# Patient Record
Sex: Male | Born: 1990 | Race: Black or African American | Hispanic: No | Marital: Single | Smoking: Current every day smoker
Health system: Southern US, Community
[De-identification: ages and names within clinical notes are randomized; demographics above are authoritative.]

## PROBLEM LIST (undated history)

## (undated) DIAGNOSIS — Z789 Other specified health status: Secondary | ICD-10-CM

## (undated) DIAGNOSIS — W3400XA Accidental discharge from unspecified firearms or gun, initial encounter: Secondary | ICD-10-CM

## (undated) HISTORY — PX: LEG SURGERY: SHX1003

---
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
## 2011-05-18 LAB — COMPREHENSIVE METABOLIC PANEL
ALT: 15
AST: 20
Albumin: 4
Alkaline Phosphatase: 72
Calcium: 9.4
Glucose, Bld: 66 — ABNORMAL LOW
Potassium: 4
Sodium: 139
Total Protein: 6.9

## 2011-05-18 LAB — WOUND CULTURE: Culture: NO GROWTH

## 2011-05-18 LAB — CBC
Hemoglobin: 14.8
MCHC: 33.6
Platelets: 227
RDW: 13.8

## 2011-05-18 LAB — ANAEROBIC CULTURE

## 2012-11-16 ENCOUNTER — Encounter (HOSPITAL_COMMUNITY): Payer: Self-pay | Admitting: Emergency Medicine

## 2012-11-16 ENCOUNTER — Emergency Department (HOSPITAL_COMMUNITY)
Admission: EM | Admit: 2012-11-16 | Discharge: 2012-11-16 | Disposition: A | Discharge status: Home / Self Care | Payer: Self-pay | Attending: Emergency Medicine | Admitting: Emergency Medicine

## 2012-11-16 DIAGNOSIS — F17200 Nicotine dependence, unspecified, uncomplicated: Secondary | ICD-10-CM | POA: Insufficient documentation

## 2012-11-16 DIAGNOSIS — K047 Periapical abscess without sinus: Secondary | ICD-10-CM | POA: Insufficient documentation

## 2012-11-16 DIAGNOSIS — R220 Localized swelling, mass and lump, head: Secondary | ICD-10-CM | POA: Insufficient documentation

## 2012-11-16 MED ORDER — HYDROCODONE-ACETAMINOPHEN 5-325 MG PO TABS
2.0000 | ORAL_TABLET | Freq: Once | ORAL | Status: AC
  Administered 2012-11-16: 2 via ORAL
  Filled 2012-11-16: qty 2

## 2012-11-16 MED ORDER — PENICILLIN V POTASSIUM 500 MG PO TABS: 500.0000 mg | ORAL_TABLET | Freq: Four times a day (QID) | ORAL | Status: AC

## 2012-11-16 MED ORDER — HYDROCODONE-ACETAMINOPHEN 5-325 MG PO TABS: 2.0000 | ORAL_TABLET | ORAL | Status: DC | PRN

## 2012-11-16 NOTE — ED Provider Notes (Signed)
History     CSN: [id number]  Arrival date & time 11/16/12  1191   None     No chief complaint on file.   (Consider location/radiation/quality/duration/timing/severity/associated sxs/prior treatment) HPI Comments: Patient is a 22 year old male who presents today with worsening left upper dental pain since Saturday. The pain is sharp and radiates into his left ear. This morning he noticed that it was really swollen. He said he coughed up green stuff which is what prompted him to come into the emergency room. He is concerned that it is just going to pop and he is going to swallow the contents of the abscess. He cannot eat or drink. When drinking he tries to hold his head to the side and swallowed the contents on his right side. He did not see a dentist. He denies fever, chills, and nausea, vomiting, abdominal pain, shortness of breath, chest pain, trismus.   No past medical history on file.  No past surgical history on file.  No family history on file.  History  Substance Use Topics  . Smoking status: Not on file  . Smokeless tobacco: Not on file  . Alcohol Use: Not on file      Review of Systems  Constitutional: Negative for fever and chills.  HENT: Positive for facial swelling. Negative for sore throat and drooling.   Respiratory: Negative for shortness of breath.   Cardiovascular: Negative for chest pain.  Gastrointestinal: Negative for nausea, vomiting and abdominal pain.  All other systems reviewed and are negative.    Allergies  Review of patient's allergies indicates not on file.  Home Medications  No current outpatient prescriptions on file.  BP 157/106  Pulse 81  Temp(Src) 97.3 F (36.3 C) (Oral)  Resp 18  SpO2 100%  Physical Exam  Nursing note and vitals reviewed. Constitutional: He is oriented to person, place, and time. He appears well-developed and well-nourished. No distress.  HENT:  Head: Normocephalic and atraumatic. No trismus in the jaw.   Right Ear: External ear normal.  Left Ear: External ear normal.  Nose: Nose normal.  Mouth/Throat: Uvula is midline and mucous membranes are normal.  Abscess on left upper gums, fluctuant No submental edema, tongue elevation, trismus No signs of deep space infection   Eyes: Conjunctivae and lids are normal.  Neck: Normal range of motion. No tracheal deviation present.  Cardiovascular: Normal rate, regular rhythm and normal heart sounds.   Pulmonary/Chest: Effort normal and breath sounds normal. No stridor.  Abdominal: Soft. He exhibits no distension. There is no tenderness.  Musculoskeletal: Normal range of motion.  Neurological: He is alert and oriented to person, place, and time.  Skin: Skin is warm and dry. He is not diaphoretic.  Psychiatric: He has a normal mood and affect. His behavior is normal.    ED Course  Procedures (including critical care time)  Labs Reviewed - No data to display No results found.  INCISION AND DRAINAGE Performed by: Junious Silk Consent: Verbal consent obtained. Risks and benefits: risks, benefits and alternatives were discussed Type: abscess  Body area: left upper gum  Anesthesia: benzocaine spray, sprayed liberally over the affected area  Incision was made with a scalpel.  Complexity: complex  Drainage: purulent  Drainage amount: 2 ML   Patient tolerance: Patient tolerated the procedure well with no immediate complications.     1. Dental abscess       MDM  Patient presents today with a dental abscess. No fevers, chills, abdominal pain. No signs of  impending airway obstruction or deep space infection. Abscess was I and D without complication. Discussed care of abscess including salt water gargles. Covered with penicillin. Referral to a dentist. Resource guide given. Pain medication given. Return precautions given. Vital signs stable for discharge. Patient / Family / Caregiver informed of clinical course, understand medical  decision-making process, and agree with plan.        Mora Bellman, PA-C 11/16/12 [address] PA-C 11/16/12 1030

## 2012-11-16 NOTE — ED Notes (Signed)
Pt reports left upper toothache onset Saturday. Pt woke up today with swelling to upper left gum area. Pt used peroxide and hot salt water rinse. Pt also used Listerine rinse.

## 2012-11-16 NOTE — ED Provider Notes (Signed)
Medical screening examination/treatment/procedure(s) were conducted as a shared visit with non-physician practitioner(s) and myself.  I personally evaluated the patient during the encounter.  22yM with gingival abscess. I&D per [facility]rell's note. Mouth rinses. Abx. PRN pain meds. Dental resources.   Raeford Razor, MD 11/16/12 [id number]

## 2015-05-10 ENCOUNTER — Encounter (HOSPITAL_COMMUNITY): Payer: Self-pay

## 2015-05-10 ENCOUNTER — Emergency Department (HOSPITAL_COMMUNITY)
Admission: EM | Admit: 2015-05-10 | Discharge: 2015-05-10 | Disposition: A | Discharge status: Home / Self Care | Payer: Self-pay | Attending: Emergency Medicine | Admitting: Emergency Medicine

## 2015-05-10 DIAGNOSIS — R300 Dysuria: Secondary | ICD-10-CM | POA: Insufficient documentation

## 2015-05-10 DIAGNOSIS — Z720 Tobacco use: Secondary | ICD-10-CM | POA: Insufficient documentation

## 2015-05-10 DIAGNOSIS — Z711 Person with feared health complaint in whom no diagnosis is made: Secondary | ICD-10-CM

## 2015-05-10 DIAGNOSIS — R369 Urethral discharge, unspecified: Secondary | ICD-10-CM | POA: Insufficient documentation

## 2015-05-10 DIAGNOSIS — Z202 Contact with and (suspected) exposure to infections with a predominantly sexual mode of transmission: Secondary | ICD-10-CM | POA: Insufficient documentation

## 2015-05-10 LAB — URINALYSIS, ROUTINE W REFLEX MICROSCOPIC
BILIRUBIN URINE: NEGATIVE
Glucose, UA: NEGATIVE mg/dL
Ketones, ur: NEGATIVE mg/dL
Nitrite: NEGATIVE
PH: 5.5 (ref 5.0–8.0)
Protein, ur: 30 mg/dL — AB
SPECIFIC GRAVITY, URINE: 1.029 (ref 1.005–1.030)
UROBILINOGEN UA: 2 mg/dL — AB (ref 0.0–1.0)

## 2015-05-10 LAB — URINE MICROSCOPIC-ADD ON

## 2015-05-10 MED ORDER — AZITHROMYCIN 250 MG PO TABS
1000.0000 mg | ORAL_TABLET | Freq: Once | ORAL | Status: AC
  Administered 2015-05-10: 1000 mg via ORAL
  Filled 2015-05-10: qty 4

## 2015-05-10 MED ORDER — CEFTRIAXONE SODIUM 250 MG IJ SOLR
250.0000 mg | Freq: Once | INTRAMUSCULAR | Status: AC
  Administered 2015-05-10: 250 mg via INTRAMUSCULAR
  Filled 2015-05-10: qty 250

## 2015-05-10 NOTE — ED Notes (Signed)
Pt verbalized understanding of d/c instructions and has no further questions. Pt verbalized understanding to abstain from intercourse for 1 week until hearing about results.

## 2015-05-10 NOTE — ED Notes (Signed)
PA at bedside.

## 2015-05-10 NOTE — ED Provider Notes (Signed)
CSN: [id number]     Arrival date & time 05/10/15  1748 History   First MD Initiated Contact with Patient 05/10/15 1817     Chief Complaint  Patient presents with  . Exposure to STD     (Consider location/radiation/quality/duration/timing/severity/associated sxs/prior Treatment) HPI Comments: TRAYCE MAINO is a 24 y.o. male who presents to the ED with complaints of dysuria and penile discharge x2 days. He reports the burning dysuria is only with urination, rates it at 8/10 at his urethra, intermittent with urination, nonradiating, with no tx tried PTA. Penile discharge is yellowish. Denies fevers, chills, CP, SOB, abd pain, N/V/D/C, hematuria, testicular pain/swelling, penile pain/swelling, genital lesions, flank pain, myalgias, arthralgias, numbness, tingling, weakness, or rashes. Sexually active with 1 male partner, unprotected.  Patient is a 24 y.o. male presenting with STD exposure. The history is provided by the patient. No language interpreter was used.  Exposure to STD This is a new problem. The current episode started in the past 7 days. The problem occurs rarely. The problem has been unchanged. Associated symptoms include urinary symptoms. Pertinent negatives include no abdominal pain, arthralgias, chest pain, chills, fever, myalgias, nausea, numbness, vomiting or weakness. Exacerbated by: urinating. He has tried nothing for the symptoms. The treatment provided no relief.    History reviewed. No pertinent past medical history. Past Surgical History  Procedure Laterality Date  . Leg surgery     No family history on file. Social History  Substance Use Topics  . Smoking status: Current Every Day Smoker  . Smokeless tobacco: None  . Alcohol Use: Yes    Review of Systems  Constitutional: Negative for fever and chills.  Respiratory: Negative for shortness of breath.   Cardiovascular: Negative for chest pain.  Gastrointestinal: Negative for nausea, vomiting, abdominal  pain, diarrhea and constipation.  Genitourinary: Positive for dysuria and discharge. Negative for hematuria, flank pain, penile swelling, scrotal swelling, genital sores, penile pain and testicular pain.  Musculoskeletal: Negative for myalgias and arthralgias.  Skin: Negative for color change.  Allergic/Immunologic: Negative for immunocompromised state.  Neurological: Negative for weakness and numbness.  Psychiatric/Behavioral: Negative for confusion.   10 Systems reviewed and are negative for acute change except as noted in the HPI.    Allergies  Review of patient's allergies indicates no known allergies.  Home Medications   Prior to Admission medications   Medication Sig Start Date End Date Taking? Authorizing Provider  HYDROcodone-acetaminophen (NORCO/VICODIN) 5-325 MG per tablet Take 2 tablets by mouth every 4 (four) hours as needed for pain. 11/16/12   Junious Silk, PA-C   BP 159/90 mmHg  Pulse 90  Temp(Src) 98.4 F (36.9 C) (Oral)  Resp 16  Ht 6' (1.829 m)  Wt 169 lb 12.8 oz (77.021 kg)  BMI 23.02 kg/m2  SpO2 100% Physical Exam  Constitutional: He is oriented to person, place, and time. Vital signs are normal. He appears well-developed and well-nourished.  Non-toxic appearance. No distress.  Afebrile, nontoxic, NAD  HENT:  Head: Normocephalic and atraumatic.  Mouth/Throat: Oropharynx is clear and moist and mucous membranes are normal.  Eyes: Conjunctivae and EOM are normal. Right eye exhibits no discharge. Left eye exhibits no discharge.  Neck: Normal range of motion. Neck supple.  Cardiovascular: Normal rate, regular rhythm, normal heart sounds and intact distal pulses.  Exam reveals no gallop and no friction rub.   No murmur heard. Pulmonary/Chest: Effort normal and breath sounds normal. No respiratory distress. He has no decreased breath sounds. He has no wheezes.  He has no rhonchi. He has no rales.  Abdominal: Soft. Normal appearance and bowel sounds are normal. He  exhibits no distension. There is no tenderness. There is no rigidity, no rebound, no guarding, no CVA tenderness, no tenderness at McBurney's point and negative Murphy's sign. Hernia confirmed negative in the right inguinal area and confirmed negative in the left inguinal area.  Genitourinary: Testes normal. Right testis shows no mass, no swelling and no tenderness. Left testis shows no mass, no swelling and no tenderness. Circumcised. No phimosis, paraphimosis, hypospadias, penile erythema or penile tenderness. Discharge found.  Chaperone present for exam Circumcised penis without phimosis/paraphimosis, hypospadias, erythema, or tenderness, but with purulent discharge noted which can be expressed from urethra. Testes with no masses or tenderness, no swelling, and cremasterics reflex present bilaterally. No inguinal hernias or adenopathy present.   Musculoskeletal: Normal range of motion.  Neurological: He is alert and oriented to person, place, and time. He has normal strength. No sensory deficit.  Skin: Skin is warm, dry and intact. No rash noted.  Psychiatric: He has a normal mood and affect.  Nursing note and vitals reviewed.   ED Course  Procedures (including critical care time) Labs Review Labs Reviewed  URINALYSIS, ROUTINE W REFLEX MICROSCOPIC (NOT AT [facility]) - Abnormal; Notable for the following:    Color, Urine AMBER (*)    APPearance TURBID (*)    Hgb urine dipstick SMALL (*)    Protein, ur 30 (*)    Urobilinogen, UA 2.0 (*)    Leukocytes, UA LARGE (*)    All other components within normal limits  URINE CULTURE  URINE MICROSCOPIC-ADD ON  RPR  HIV ANTIBODY (ROUTINE TESTING)  GC/CHLAMYDIA PROBE AMP ([place]) NOT AT [facility]    Imaging Review No results found. I have personally reviewed and evaluated these images and lab results as part of my medical decision-making.   EKG Interpretation None      MDM   Final diagnoses:  Penile discharge  Dysuria  Concern about STD in  male without diagnosis    24 y.o. male here with dysuria and penile discharge. On exam, purulent drainage noted, no tenderness to penis or testicles, no skin changes. U/A sent, still pending. STD testing done now, will treat empirically for GC/CT. Discussed using condoms. Will have him f/up with [facility] for any future STD concerns. Will await U/A then reassess.  6:48 PM U/A with large leuks, rare squamous, rare bacteria, and TNTC WBC. Empiric treatment for GC/CT to be given then pt will be discharged home. F/up with [facility] for future STD concerns. I explained the diagnosis and have given explicit precautions to return to the ER including for any other new or worsening symptoms. The patient understands and accepts the medical plan as it's been dictated and I have answered their questions. Discharge instructions concerning home care and prescriptions have been given. The patient is STABLE and is discharged to home in good condition.  BP 159/90 mmHg  Pulse 90  Temp(Src) 98.4 F (36.9 C) (Oral)  Resp 16  Ht 6' (1.829 m)  Wt 169 lb 12.8 oz (77.021 kg)  BMI 23.02 kg/m2  SpO2 100%  Meds ordered this encounter  Medications  . azithromycin (ZITHROMAX) tablet 1,000 mg    Sig:    And  . cefTRIAXone (ROCEPHIN) injection 250 mg    Sig:     Order Specific Question:  Antibiotic Indication:    Answer:  STD  [address] [place], PA-C 05/10/15 1849  Eber Hong, MD 05/11/15 1625

## 2015-05-10 NOTE — Discharge Instructions (Signed)
Follow up with [facility] Department STD clinic for future STD concerns or screenings. This is the recommendation by the CDC for people with multiple sexual partners or hx of STDs. You have been treated for gonorrhea and chlamydia in the ER but the hospital will call you if lab is positive. You were tested for HIV and Syphilis, and the hospital will call you if the lab is positive. Always wear condoms when engaging in sexual activity. Since you had treatment today for presumed gonorrhea and chlamydia, refrain from sexual intercourse until you've heard back from the lab about your test results.    Dysuria Dysuria is pain or discomfort while urinating. The pain or discomfort may be felt in the tube that carries urine out of the bladder (urethra) or in the surrounding tissue of the genitals. The pain may also be felt in the groin area, lower abdomen, and lower back. You may have to urinate frequently or have the sudden feeling that you have to urinate (urgency). Dysuria can affect both men and women, but is more common in women. Dysuria can be caused by many different things, including:  Urinary tract infection in women.  Infection of the kidney or bladder.  Kidney stones or bladder stones.  Certain sexually transmitted infections (STIs), such as chlamydia.  Dehydration.  Inflammation of the vagina.  Use of certain medicines.  Use of certain soaps or scented products that cause irritation. HOME CARE INSTRUCTIONS Watch your dysuria for any changes. The following actions may help to reduce any discomfort you are feeling:  Drink enough fluid to keep your urine clear or pale yellow.  Empty your bladder often. Avoid holding urine for long periods of time.  After a bowel movement or urination, women should cleanse from front to back, using each tissue only once.  Empty your bladder after sexual intercourse.  Take medicines only as directed by your health care provider.  If you  were prescribed an antibiotic medicine, finish it all even if you start to feel better.  Avoid caffeine, tea, and alcohol. They can irritate the bladder and make dysuria worse. In men, alcohol may irritate the prostate.  Keep all follow-up visits as directed by your health care provider. This is important.  If you had any tests done to find the cause of dysuria, it is your responsibility to obtain your test results. Ask the lab or department performing the test when and how you will get your results. Talk with your health care provider if you have any questions about your results. SEEK MEDICAL CARE IF:  You develop pain in your back or sides.  You have a fever.  You have nausea or vomiting.  You have blood in your urine.  You are not urinating as often as you usually do. SEEK IMMEDIATE MEDICAL CARE IF:  You pain is severe and not relieved with medicines.  You are unable to hold down any fluids.  You or someone else notices a change in your mental function.  You have a rapid heartbeat at rest.  You have shaking or chills.  You feel extremely weak.   This information is not intended to replace advice given to you by your health care provider. Make sure you discuss any questions you have with your health care provider.   Document Released: 04/16/2004 Document Revised: 08/09/2014 Document Reviewed: 03/14/2014 Elsevier Interactive Patient Education [employer]

## 2015-05-10 NOTE — ED Notes (Signed)
Pt reports dysuria and purulent drainage from penis. Onset Thursday. Wants to be tested for STDs.

## 2015-05-11 LAB — RPR: RPR Ser Ql: NONREACTIVE

## 2015-05-11 LAB — HIV ANTIBODY (ROUTINE TESTING W REFLEX): HIV Screen 4th Generation wRfx: NONREACTIVE

## 2015-05-12 LAB — URINE CULTURE

## 2015-05-12 LAB — GC/CHLAMYDIA PROBE AMP (~~LOC~~) NOT AT ARMC
CHLAMYDIA, DNA PROBE: NEGATIVE
NEISSERIA GONORRHEA: POSITIVE — AB

## 2015-05-13 ENCOUNTER — Telehealth (HOSPITAL_BASED_OUTPATIENT_CLINIC_OR_DEPARTMENT_OTHER): Payer: Self-pay | Admitting: Emergency Medicine

## 2015-12-09 ENCOUNTER — Encounter (HOSPITAL_COMMUNITY): Payer: Self-pay | Admitting: *Deleted

## 2015-12-09 ENCOUNTER — Emergency Department (HOSPITAL_COMMUNITY)
Admission: EM | Admit: 2015-12-09 | Discharge: 2015-12-09 | Disposition: A | Discharge status: Home / Self Care | Payer: Self-pay | Attending: Emergency Medicine | Admitting: Emergency Medicine

## 2015-12-09 DIAGNOSIS — K0889 Other specified disorders of teeth and supporting structures: Secondary | ICD-10-CM | POA: Insufficient documentation

## 2015-12-09 DIAGNOSIS — Z792 Long term (current) use of antibiotics: Secondary | ICD-10-CM | POA: Insufficient documentation

## 2015-12-09 DIAGNOSIS — F17210 Nicotine dependence, cigarettes, uncomplicated: Secondary | ICD-10-CM | POA: Insufficient documentation

## 2015-12-09 MED ORDER — PENICILLIN V POTASSIUM 500 MG PO TABS: 500.0000 mg | ORAL_TABLET | Freq: Four times a day (QID) | ORAL | Status: DC

## 2015-12-09 NOTE — ED Notes (Signed)
No answer from waiting room.

## 2015-12-09 NOTE — ED Provider Notes (Signed)
History  By signing my name below, I, Sean [name], attest that this documentation has been prepared under the direction and in the presence of Rob [place], [place]. Electronically Signed: Karle [name], ED Scribe. 12/09/2015. 3:32 PM.  Chief Complaint  Patient presents with  . Dental Pain  . Oral Swelling   The history is provided by the patient and medical records. No language interpreter was used.    HPI Comments:  Sean [name] is a 25 y.o. male who presents to the Emergency Department complaining of severe left upper dental pain that began two days ago. He reports associated left sided facial swelling. He states he believes he has an abscess and states he does not have a tooth in the area. He does not currently have a dentist. He has not taken anything for pain. Talking and eating increases his pain. He denies alleviating factors. He denies fever, chills, nausea, vomiting. He denies any known medications allergies.   History reviewed. No pertinent past medical history. Past Surgical History  Procedure Laterality Date  . Leg surgery     No family history on file. Social History  Substance Use Topics  . Smoking status: Current Every Day Smoker -- 1.00 packs/day    Types: Cigarettes  . Smokeless tobacco: None  . Alcohol Use: Yes     Comment: occ    Review of Systems  Constitutional: Negative for fever and chills.  HENT: Positive for dental problem and facial swelling.   Gastrointestinal: Negative for nausea and vomiting.    Allergies  Review of patient's allergies indicates no known allergies.  Home Medications   Prior to Admission medications   Medication Sig Start Date End Date Taking? Authorizing Provider  penicillin v potassium (VEETID) 500 MG tablet Take 1 tablet (500 mg total) by mouth 4 (four) times daily. 12/09/15   Roxy Horseman, PA-C   Triage Vitals: BP 164/98 mmHg  Pulse 92  Temp(Src) 99 F (37.2 C) (Oral)  Resp 18  SpO2 100% Physical Exam   Physical Exam  Constitutional: Pt appears well-developed and well-nourished.  HENT:  Head: Normocephalic.  Right Ear: Tympanic membrane, external ear and ear canal normal.  Left Ear: Tympanic membrane, external ear and ear canal normal.  Nose: Nose normal. Right sinus exhibits no maxillary sinus tenderness and no frontal sinus tenderness. Left sinus exhibits no maxillary sinus tenderness and no frontal sinus tenderness.  Mouth/Throat: Uvula is midline, oropharynx is clear and moist and mucous membranes are normal. No oral lesions. No uvula swelling or lacerations. No oropharyngeal exudate, posterior oropharyngeal edema, posterior oropharyngeal erythema or tonsillar abscesses.  Poor dentition No gingival swelling, fluctuance or induration No gross abscess  No sublingual edema, tenderness to palpation, or sign of Ludwig's angina, or deep space infection Pain at left upper rear molar Eyes: Conjunctivae are normal. Pupils are equal, round, and reactive to light. Right eye exhibits no discharge. Left eye exhibits no discharge.  Neck: Normal range of motion. Neck supple.  No stridor Handling secretions without difficulty No nuchal rigidity No cervical lymphadenopathy Cardiovascular: Normal rate, regular rhythm and normal heart sounds.   Pulmonary/Chest: Effort normal. No respiratory distress.  Equal chest rise  Abdominal: Soft. Bowel sounds are normal. Pt exhibits no distension. There is no tenderness.  Lymphadenopathy: Pt has no cervical adenopathy.  Neurological: Pt is alert and oriented x 4  Skin: Skin is warm and dry.  Psychiatric: Pt has a normal mood and affect.  Nursing note and vitals reviewed.    ED  Course  Procedures (including critical care time) DIAGNOSTIC STUDIES: Oxygen Saturation is 100% on RA, normal by my interpretation.   COORDINATION OF CARE: 3:30 PM- Will prescribe antibiotics and give dental referral. Advised pt to take OTC Ibuprofen/Tylenol for pain. Pt  verbalizes understanding and agrees to plan.    MDM   Final diagnoses:  Pain, dental    Patient with dentalgia.  No abscess requiring immediate incision and drainage.  Exam not concerning for Ludwig's angina or pharyngeal abscess.  Will treat with penicillin. Pt instructed to follow-up with dentist.  Discussed return precautions. Pt safe for discharge.   I personally performed the services described in this documentation, which was scribed in my presence. The recorded information has been reviewed and is accurate.       Roxy Horsemanobert Daissy Yerian, PA-C 12/09/15 1533  Pricilla LovelessScott Goldston, MD 12/10/15 1655

## 2015-12-09 NOTE — Discharge Instructions (Signed)
Community Resource Guide Dental °The [employer]’s “211” is a great source of information about community services available.  Access by dialing 2-1-1 from anywhere in [place], or by website -  www.nc211.org.  ° °Other Local Resources (Updated 08/2015) ° °Dental  Care °  °Services ° °  °Phone Number and Address  °Cost  °[facility] For children 0 - 26 years of age:  °• Cleaning °• Tooth brushing/flossing instruction °• Sealants, fillings, crowns °• Extractions °• Emergency treatment  [phone] °[address] °[place], [place] [address] Charges based on family income.  Medicaid and some insurance plans accepted.   °  °[facility] [place] • Cleaning °• Sealants, fillings, crowns °• Extractions °• Emergency treatment [phone] °[address] °[place], [place] ° Pregnant women 18 years of age or older with a Medicaid card  °[facility] [place] • Cleaning °• Sealants, fillings, crowns °• Extractions °• Emergency treatment [phone] °[address] °[place], [place] Pregnant women 18 years of age or older with a Medicaid card  °[facility] For children 0 - 26 years of age:  °• Cleaning °• Tooth brushing/flossing instruction °• Sealants, fillings, crowns °• Extractions °• Emergency treatment °Limited orthodontic services for patients with Medicaid [phone] °[address] °[place], [place] [address] Medicaid and [place] Health Choice cover for children up to age 26 and pregnant women.  Parents of children up to age 26 without Medicaid pay a reduced fee at time of service.  °[facility] [place] For children 0 - 26 years of age:  °• Cleaning °• Tooth brushing/flossing instruction °• Sealants, fillings, crowns °• Extractions °• Emergency treatment °Limited orthodontic services for patients with Medicaid  [phone] °[address] °[place], [place].  Medicaid and [place] Health Choice cover for children up to age 26 and pregnant women.  Parents of children up to age 26 without Medicaid pay a reduced fee.  °[facility] • Cleaning °• Sealants, fillings, crowns °• Extractions ° °Hours: Tuesdays and Thursdays, 4:15 - 8 pm [phone] °[address] °[place], [place] [address] Services free of charge to [place] residents ages 18-64 who do not have health insurance, Medicare, Medicaid, or VA benefits and fall within federal poverty guidelines  °[facility] ° ° ° Provides dental care in addition to primary medical care, nutritional counseling, and pharmacy: °• Cleaning °• Sealants, fillings, crowns °• Extractions ° ° ° ° ° ° ° ° ° ° ° ° ° ° ° ° ° [phone] °[facility], [address] °[place], [place] ° °[phone] °[facility], [address] [place], [place] ° °[phone] °[facility] °[place], [place] ° °[phone] °[facility], [address] °[place], [place] ° °[phone] °[facility] °[address] °[place], [place] Accepts Medicaid, Medicare, most insurance.  Also provides services available to all with fees adjusted based on ability to pay.    °[facility] • Cleaning °• Tooth brushing/flossing instruction °• Sealants, fillings, crowns °• Extractions °• Emergency treatment °Hours: Tuesdays, Thursdays, and Fridays from 8 am to 5 pm by appointment only. [phone] °[address] °[place], [place] [address] [place] residents with Medicaid (depending on eligibility) and children with [place] Health Choice - call for more information.  °  [facility] • Extractions only ° °Hours: 2nd and 4th Thursday of each month from 6:30 am - 9 am.   [phone] ext. [phone] °[address] °[place], [place] [address] Ages 18 and older only.  Patients are seen on a first come, first served basis.  °[facility] • Cleanings °• Fillings °• Extractions °• Orthodontics °• Endodontics °• Implants/Crowns/Bridges °• Complete and partial dentures [phone] °[place], [place] Patients must complete an application for services.  There is often a waiting list.   ° °

## 2015-12-09 NOTE — ED Notes (Signed)
Pt reports dental pain and swelling x 2 days.  Has a broken L upper tooth.

## 2015-12-09 NOTE — ED Notes (Signed)
Called x 1 no answer

## 2016-06-29 ENCOUNTER — Encounter (HOSPITAL_COMMUNITY): Payer: Self-pay | Admitting: Emergency Medicine

## 2016-06-29 ENCOUNTER — Ambulatory Visit (HOSPITAL_COMMUNITY)
Admission: EM | Admit: 2016-06-29 | Discharge: 2016-06-29 | Disposition: A | Discharge status: Home / Self Care | Payer: Self-pay | Attending: Family Medicine | Admitting: Family Medicine

## 2016-06-29 DIAGNOSIS — R05 Cough: Secondary | ICD-10-CM | POA: Insufficient documentation

## 2016-06-29 DIAGNOSIS — R509 Fever, unspecified: Secondary | ICD-10-CM | POA: Insufficient documentation

## 2016-06-29 DIAGNOSIS — Z0001 Encounter for general adult medical examination with abnormal findings: Secondary | ICD-10-CM | POA: Insufficient documentation

## 2016-06-29 DIAGNOSIS — F17210 Nicotine dependence, cigarettes, uncomplicated: Secondary | ICD-10-CM | POA: Insufficient documentation

## 2016-06-29 DIAGNOSIS — R6889 Other general symptoms and signs: Secondary | ICD-10-CM

## 2016-06-29 DIAGNOSIS — J40 Bronchitis, not specified as acute or chronic: Secondary | ICD-10-CM | POA: Insufficient documentation

## 2016-06-29 HISTORY — DX: Accidental discharge from unspecified firearms or gun, initial encounter: W34.00XA

## 2016-06-29 MED ORDER — IBUPROFEN 800 MG PO TABS
ORAL_TABLET | ORAL | Status: AC
  Filled 2016-06-29: qty 1

## 2016-06-29 MED ORDER — LIDOCAINE HCL 2 % IJ SOLN
INTRAMUSCULAR | Status: AC
  Filled 2016-06-29: qty 20

## 2016-06-29 MED ORDER — HYDROCODONE-HOMATROPINE 5-1.5 MG/5ML PO SYRP: 5.0000 mL | ORAL_SOLUTION | Freq: Four times a day (QID) | ORAL | 0 refills | Status: DC | PRN

## 2016-06-29 MED ORDER — ACETAMINOPHEN 325 MG PO TABS
ORAL_TABLET | ORAL | Status: AC
  Filled 2016-06-29: qty 3

## 2016-06-29 MED ORDER — IBUPROFEN 800 MG PO TABS
800.0000 mg | ORAL_TABLET | Freq: Once | ORAL | Status: AC
  Administered 2016-06-29: 800 mg via ORAL

## 2016-06-29 MED ORDER — ACETAMINOPHEN 325 MG PO TABS
650.0000 mg | ORAL_TABLET | Freq: Once | ORAL | Status: AC
  Administered 2016-06-29: 650 mg via ORAL

## 2016-06-29 MED ORDER — AZITHROMYCIN 250 MG PO TABS: 250.0000 mg | ORAL_TABLET | Freq: Every day | ORAL | 0 refills | Status: DC
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
## 2016-06-29 NOTE — ED Triage Notes (Signed)
General body aches.  Reports running a fever last night.  Runny nose, cough, says green phlegm.

## 2016-06-30 LAB — POCT RAPID STREP A: Streptococcus, Group A Screen (Direct): NEGATIVE

## 2016-07-02 LAB — CULTURE, GROUP A STREP (THRC)

## 2018-02-03 ENCOUNTER — Emergency Department (HOSPITAL_COMMUNITY)
Admission: EM | Admit: 2018-02-03 | Discharge: 2018-02-03 | Disposition: A | Discharge status: Home / Self Care | Payer: Self-pay | Attending: Emergency Medicine | Admitting: Emergency Medicine

## 2018-02-03 ENCOUNTER — Other Ambulatory Visit: Payer: Self-pay

## 2018-02-03 DIAGNOSIS — Z79899 Other long term (current) drug therapy: Secondary | ICD-10-CM | POA: Insufficient documentation

## 2018-02-03 DIAGNOSIS — F17210 Nicotine dependence, cigarettes, uncomplicated: Secondary | ICD-10-CM | POA: Insufficient documentation

## 2018-02-03 DIAGNOSIS — L03012 Cellulitis of left finger: Secondary | ICD-10-CM | POA: Insufficient documentation

## 2018-02-03 DIAGNOSIS — Z23 Encounter for immunization: Secondary | ICD-10-CM | POA: Insufficient documentation

## 2018-02-03 MED ORDER — CEPHALEXIN 500 MG PO CAPS: 500.0000 mg | ORAL_CAPSULE | Freq: Four times a day (QID) | ORAL | 0 refills | Status: DC

## 2018-02-03 MED ORDER — TETANUS-DIPHTH-ACELL PERTUSSIS 5-2.5-18.5 LF-MCG/0.5 IM SUSP
0.5000 mL | Freq: Once | INTRAMUSCULAR | Status: AC
  Administered 2018-02-03: 0.5 mL via INTRAMUSCULAR
  Filled 2018-02-03: qty 0.5

## 2018-02-03 MED ORDER — LIDOCAINE HCL (PF) 1 % IJ SOLN
10.0000 mL | Freq: Once | INTRAMUSCULAR | Status: AC
  Administered 2018-02-03: 10 mL
  Filled 2018-02-03: qty 10

## 2018-02-03 NOTE — ED Provider Notes (Signed)
Patient placed in Quick Look pathway, seen and evaluated   Chief Complaint:  Left thumb pain  HPI:   Sean MangesRichard A [name] is a 27 y.o. male who presents to the ED with with left thumb swelling and pain. Patient reports that the symptoms started 3 days ago as a hangnail and has gotten worse. Patient tried to stick a needle in the area and it got worse.   ROS: skin: infection, swelling and pain to the left thumb  Physical Exam:   BP (!) 155/108 (BP Location: Right Arm)   Pulse 95   Temp 98.5 F (36.9 C) (Oral)   Resp 16   SpO2 98%   Gen: No distress  Neuro: Awake and Alert  Skin: swelling and erythema to the distal aspect of the left thumb.     Initiation of care has begun. The patient has been counseled on the process, plan, and necessity for staying for the completion/evaluation, and the remainder of the medical screening examination    Janne Napoleoneese, Zaccai Chavarin M, NP 02/03/18 1320    Jacalyn LefevreHaviland, Julie, MD 02/03/18 1414
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
## 2018-02-03 NOTE — ED Notes (Signed)
Bacitracin and clean dressing applied to right thumb.

## 2018-02-03 NOTE — ED Triage Notes (Signed)
Patient to ED c/o L thumb pain x 3 days - started as hangnail that he pulled and after seeing increased pus, tried to drain it with a needle himself. Swelling noted to distal end of L thumb. Pt denies fevers.

## 2018-02-03 NOTE — Discharge Instructions (Addendum)
Please take entire course of antibiotics as directed, soak finger in warm clean water 2-3 times a day to promote drainage, return to the emergency department if you have any worsening swelling, redness or recollection of fluid in your finger or any fevers.

## 2018-07-21 ENCOUNTER — Emergency Department (HOSPITAL_COMMUNITY): Payer: Self-pay

## 2018-07-21 ENCOUNTER — Encounter (HOSPITAL_COMMUNITY): Payer: Self-pay | Admitting: Emergency Medicine

## 2018-07-21 ENCOUNTER — Emergency Department (HOSPITAL_COMMUNITY)
Admission: EM | Admit: 2018-07-21 | Discharge: 2018-07-21 | Disposition: A | Discharge status: Home / Self Care | Payer: Self-pay | Attending: Emergency Medicine | Admitting: Emergency Medicine

## 2018-07-21 DIAGNOSIS — R0789 Other chest pain: Secondary | ICD-10-CM | POA: Insufficient documentation

## 2018-07-21 DIAGNOSIS — R0981 Nasal congestion: Secondary | ICD-10-CM | POA: Insufficient documentation

## 2018-07-21 DIAGNOSIS — F17210 Nicotine dependence, cigarettes, uncomplicated: Secondary | ICD-10-CM | POA: Insufficient documentation

## 2018-07-21 DIAGNOSIS — J40 Bronchitis, not specified as acute or chronic: Secondary | ICD-10-CM | POA: Insufficient documentation

## 2018-07-21 DIAGNOSIS — R9431 Abnormal electrocardiogram [ECG] [EKG]: Secondary | ICD-10-CM | POA: Insufficient documentation

## 2018-07-21 LAB — BASIC METABOLIC PANEL
ANION GAP: 9 (ref 5–15)
BUN: 6 mg/dL (ref 6–20)
CO2: 24 mmol/L (ref 22–32)
Calcium: 9.4 mg/dL (ref 8.9–10.3)
Chloride: 105 mmol/L (ref 98–111)
Creatinine, Ser: 0.87 mg/dL (ref 0.61–1.24)
GFR calc non Af Amer: >60 mL/min (ref >60)
GLUCOSE: 101 mg/dL — AB (ref 70–99)
POTASSIUM: 3.9 mmol/L (ref 3.5–5.1)
Sodium: 138 mmol/L (ref 135–145)

## 2018-07-21 LAB — CBC WITH DIFFERENTIAL/PLATELET
ABS IMMATURE GRANULOCYTES: 0.03 10*3/uL (ref 0.00–0.07)
Basophils Absolute: 0 10*3/uL (ref 0.0–0.1)
Basophils Relative: 1 %
Eosinophils Absolute: 0.1 10*3/uL (ref 0.0–0.5)
Eosinophils Relative: 1 %
HEMATOCRIT: 51 % (ref 39.0–52.0)
Hemoglobin: 16.6 g/dL (ref 13.0–17.0)
IMMATURE GRANULOCYTES: 1 %
LYMPHS ABS: 2 10*3/uL (ref 0.7–4.0)
Lymphocytes Relative: 32 %
MCH: 30.1 pg (ref 26.0–34.0)
MCHC: 32.5 g/dL (ref 30.0–36.0)
MCV: 92.4 fL (ref 80.0–100.0)
MONOS PCT: 11 %
Monocytes Absolute: 0.7 10*3/uL (ref 0.1–1.0)
NEUTROS ABS: 3.5 10*3/uL (ref 1.7–7.7)
Neutrophils Relative %: 54 %
Platelets: 209 10*3/uL (ref 150–400)
RBC: 5.52 MIL/uL (ref 4.22–5.81)
RDW: 15.7 % — ABNORMAL HIGH (ref 11.5–15.5)
WBC: 6.3 10*3/uL (ref 4.0–10.5)
nRBC: 0 % (ref 0.0–0.2)

## 2018-07-21 LAB — I-STAT TROPONIN, ED: Troponin i, poc: 0 ng/mL (ref 0.00–0.08)

## 2018-07-21 LAB — MAGNESIUM: MAGNESIUM: 1.7 mg/dL (ref 1.7–2.4)

## 2018-07-21 MED ORDER — AZITHROMYCIN 250 MG PO TABS: 250.0000 mg | ORAL_TABLET | Freq: Every day | ORAL | 0 refills | Status: DC

## 2018-07-21 MED ORDER — ALBUTEROL SULFATE HFA 108 (90 BASE) MCG/ACT IN AERS
2.0000 | INHALATION_SPRAY | Freq: Once | RESPIRATORY_TRACT | Status: AC
  Administered 2018-07-21: 2 via RESPIRATORY_TRACT
  Filled 2018-07-21: qty 6.7

## 2018-07-21 MED ORDER — ACETAMINOPHEN 500 MG PO TABS
1000.0000 mg | ORAL_TABLET | Freq: Once | ORAL | Status: AC
  Administered 2018-07-21: 1000 mg via ORAL
  Filled 2018-07-21: qty 2

## 2018-07-21 NOTE — Discharge Instructions (Signed)
You were seen in the ER for cough and right-sided chest pain.  I think your symptoms are from bronchitis or possibly early pneumonia.  We will treat this with albuterol inhaler and azithromycin.  Take these as prescribed.  You can use over-the-counter nasal sprays to help with nasal congestion.  Take ibuprofen or acetaminophen for chest wall pain.  We discussed the abnormalities on her EKG.  These are not specific.  You need to follow-up with cardiology for further discussion of this to determine if any more work-up needs to be done.  Return to the ER for fever, worsening cough, shortness of breath or chest pain on exertion, palpitations, lightheadedness.  Try to cut back on cigarette use.

## 2018-07-21 NOTE — ED Notes (Addendum)
Pt stable and ambulatory for discharge, states understanding follow up regarding cardiologist and with high blood pressure

## 2018-07-21 NOTE — ED Triage Notes (Signed)
Pt in c/o right rib/flank pain, states he has been coughing the last several days and coughed so hard last night he vomited x1, noted streaks of blood in his vomit, post tussive emesis occurred again x1 this morning, no distress noted
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
## 2018-07-22 ENCOUNTER — Other Ambulatory Visit: Payer: Self-pay

## 2018-07-22 ENCOUNTER — Inpatient Hospital Stay (HOSPITAL_COMMUNITY)
Admission: EM | Admit: 2018-07-22 | Discharge: 2018-07-26 | DRG: 194 | Disposition: A | Discharge status: Home / Self Care | Payer: Self-pay | Attending: Internal Medicine | Admitting: Internal Medicine

## 2018-07-22 ENCOUNTER — Encounter (HOSPITAL_COMMUNITY): Payer: Self-pay | Admitting: Internal Medicine

## 2018-07-22 ENCOUNTER — Emergency Department (HOSPITAL_COMMUNITY): Payer: Self-pay

## 2018-07-22 DIAGNOSIS — J181 Lobar pneumonia, unspecified organism: Secondary | ICD-10-CM

## 2018-07-22 DIAGNOSIS — F17210 Nicotine dependence, cigarettes, uncomplicated: Secondary | ICD-10-CM | POA: Diagnosis present

## 2018-07-22 DIAGNOSIS — J189 Pneumonia, unspecified organism: Principal | ICD-10-CM | POA: Diagnosis present

## 2018-07-22 DIAGNOSIS — R042 Hemoptysis: Secondary | ICD-10-CM

## 2018-07-22 DIAGNOSIS — I10 Essential (primary) hypertension: Secondary | ICD-10-CM | POA: Diagnosis present

## 2018-07-22 DIAGNOSIS — Z79899 Other long term (current) drug therapy: Secondary | ICD-10-CM

## 2018-07-22 DIAGNOSIS — Z6835 Body mass index (BMI) 35.0-35.9, adult: Secondary | ICD-10-CM

## 2018-07-22 DIAGNOSIS — E872 Acidosis: Secondary | ICD-10-CM | POA: Diagnosis present

## 2018-07-22 DIAGNOSIS — E669 Obesity, unspecified: Secondary | ICD-10-CM | POA: Diagnosis present

## 2018-07-22 DIAGNOSIS — Z720 Tobacco use: Secondary | ICD-10-CM

## 2018-07-22 HISTORY — DX: Other specified health status: Z78.9

## 2018-07-22 LAB — CBC WITH DIFFERENTIAL/PLATELET
Abs Immature Granulocytes: 0.02 10*3/uL (ref 0.00–0.07)
BASOS ABS: 0 10*3/uL (ref 0.0–0.1)
BASOS PCT: 1 %
EOS ABS: 0 10*3/uL (ref 0.0–0.5)
Eosinophils Relative: 0 %
HCT: 51 % (ref 39.0–52.0)
Hemoglobin: 16.4 g/dL (ref 13.0–17.0)
Immature Granulocytes: 0 %
LYMPHS ABS: 1.6 10*3/uL (ref 0.7–4.0)
Lymphocytes Relative: 22 %
MCH: 30.1 pg (ref 26.0–34.0)
MCHC: 32.2 g/dL (ref 30.0–36.0)
MCV: 93.6 fL (ref 80.0–100.0)
Monocytes Absolute: 0.7 10*3/uL (ref 0.1–1.0)
Monocytes Relative: 10 %
NEUTROS PCT: 67 %
NRBC: 0 % (ref 0.0–0.2)
Neutro Abs: 4.9 10*3/uL (ref 1.7–7.7)
PLATELETS: 209 10*3/uL (ref 150–400)
RBC: 5.45 MIL/uL (ref 4.22–5.81)
RDW: 15.8 % — AB (ref 11.5–15.5)
WBC: 7.2 10*3/uL (ref 4.0–10.5)

## 2018-07-22 LAB — TYPE AND SCREEN
ABO/RH(D): O POS
Antibody Screen: NEGATIVE

## 2018-07-22 LAB — RESPIRATORY PANEL BY PCR
Adenovirus: NOT DETECTED
Bordetella pertussis: NOT DETECTED
Chlamydophila pneumoniae: NOT DETECTED
Coronavirus 229E: NOT DETECTED
Coronavirus HKU1: NOT DETECTED
Coronavirus NL63: NOT DETECTED
Coronavirus OC43: NOT DETECTED
INFLUENZA B-RVPPCR: NOT DETECTED
Influenza A: NOT DETECTED
Metapneumovirus: NOT DETECTED
Mycoplasma pneumoniae: NOT DETECTED
Parainfluenza Virus 1: NOT DETECTED
Parainfluenza Virus 2: NOT DETECTED
Parainfluenza Virus 3: NOT DETECTED
Parainfluenza Virus 4: NOT DETECTED
Respiratory Syncytial Virus: NOT DETECTED
Rhinovirus / Enterovirus: NOT DETECTED

## 2018-07-22 LAB — INFLUENZA PANEL BY PCR (TYPE A & B)
INFLAPCR: NEGATIVE
INFLBPCR: NEGATIVE

## 2018-07-22 LAB — COMPREHENSIVE METABOLIC PANEL
ALBUMIN: 4 g/dL (ref 3.5–5.0)
ALK PHOS: 61 U/L (ref 38–126)
ALT: 18 U/L (ref 0–44)
AST: 24 U/L (ref 15–41)
Anion gap: 13 (ref 5–15)
BUN: 6 mg/dL (ref 6–20)
CALCIUM: 9.4 mg/dL (ref 8.9–10.3)
CHLORIDE: 105 mmol/L (ref 98–111)
CO2: 19 mmol/L — AB (ref 22–32)
Creatinine, Ser: 1.14 mg/dL (ref 0.61–1.24)
GFR calc non Af Amer: >60 mL/min (ref >60)
GLUCOSE: 147 mg/dL — AB (ref 70–99)
Potassium: 3.6 mmol/L (ref 3.5–5.1)
SODIUM: 137 mmol/L (ref 135–145)
Total Bilirubin: 1 mg/dL (ref 0.3–1.2)
Total Protein: 7.4 g/dL (ref 6.5–8.1)

## 2018-07-22 LAB — ABO/RH: ABO/RH(D): O POS

## 2018-07-22 LAB — STREP PNEUMONIAE URINARY ANTIGEN: Strep Pneumo Urinary Antigen: NEGATIVE

## 2018-07-22 LAB — I-STAT CG4 LACTIC ACID, ED
LACTIC ACID, VENOUS: 2.15 mmol/L — AB (ref 0.5–1.9)
LACTIC ACID, VENOUS: 0.98 mmol/L (ref 0.5–1.9)

## 2018-07-22 LAB — PROTIME-INR
INR: 1
Prothrombin Time: 13.1 seconds (ref 11.4–15.2)

## 2018-07-22 MED ORDER — IOPAMIDOL (ISOVUE-370) INJECTION 76%
100.0000 mL | Freq: Once | INTRAVENOUS | Status: AC | PRN
  Administered 2018-07-22: 100 mL via INTRAVENOUS

## 2018-07-22 MED ORDER — SODIUM CHLORIDE 0.9 % IV SOLN
1.0000 g | Freq: Once | INTRAVENOUS | Status: AC
  Administered 2018-07-22: 1 g via INTRAVENOUS
  Filled 2018-07-22: qty 10

## 2018-07-22 MED ORDER — SODIUM CHLORIDE 0.9 % IV SOLN
500.0000 mg | INTRAVENOUS | Status: DC
  Administered 2018-07-23 – 2018-07-26 (×4): 500 mg via INTRAVENOUS
  Filled 2018-07-22 (×4): qty 500

## 2018-07-22 MED ORDER — HYDROCODONE-HOMATROPINE 5-1.5 MG/5ML PO SYRP: 5.0000 mL | ORAL_SOLUTION | Freq: Four times a day (QID) | ORAL | Status: DC | PRN

## 2018-07-22 MED ORDER — FENTANYL CITRATE (PF) 100 MCG/2ML IJ SOLN
50.0000 ug | Freq: Once | INTRAMUSCULAR | Status: AC
  Administered 2018-07-22: 50 ug via INTRAVENOUS
  Filled 2018-07-22: qty 2

## 2018-07-22 MED ORDER — ALBUTEROL SULFATE (2.5 MG/3ML) 0.083% IN NEBU
2.5000 mg | INHALATION_SOLUTION | Freq: Four times a day (QID) | RESPIRATORY_TRACT | Status: DC
  Administered 2018-07-22 – 2018-07-23 (×4): 2.5 mg via RESPIRATORY_TRACT
  Filled 2018-07-22 (×4): qty 3

## 2018-07-22 MED ORDER — LACTATED RINGERS IV SOLN
INTRAVENOUS | Status: AC
  Administered 2018-07-22 (×2): via INTRAVENOUS

## 2018-07-22 MED ORDER — ALBUTEROL SULFATE HFA 108 (90 BASE) MCG/ACT IN AERS
1.0000 | INHALATION_SPRAY | Freq: Four times a day (QID) | RESPIRATORY_TRACT | Status: DC | PRN
  Filled 2018-07-22: qty 6.7

## 2018-07-22 MED ORDER — PREDNISONE 20 MG PO TABS
20.0000 mg | ORAL_TABLET | Freq: Every day | ORAL | Status: AC
  Administered 2018-07-22 – 2018-07-26 (×5): 20 mg via ORAL
  Filled 2018-07-22 (×5): qty 1

## 2018-07-22 MED ORDER — SENNOSIDES-DOCUSATE SODIUM 8.6-50 MG PO TABS: 1.0000 | ORAL_TABLET | Freq: Every evening | ORAL | Status: DC | PRN

## 2018-07-22 MED ORDER — SODIUM CHLORIDE 0.9 % IV SOLN
500.0000 mg | Freq: Once | INTRAVENOUS | Status: AC
  Administered 2018-07-22: 500 mg via INTRAVENOUS
  Filled 2018-07-22: qty 500

## 2018-07-22 MED ORDER — ACETAMINOPHEN 650 MG RE SUPP: 650.0000 mg | Freq: Four times a day (QID) | RECTAL | Status: DC | PRN

## 2018-07-22 MED ORDER — IOPAMIDOL (ISOVUE-370) INJECTION 76%
INTRAVENOUS | Status: AC
  Filled 2018-07-22: qty 100

## 2018-07-22 MED ORDER — TRAMADOL HCL 50 MG PO TABS
50.0000 mg | ORAL_TABLET | Freq: Four times a day (QID) | ORAL | Status: DC | PRN
  Administered 2018-07-22 – 2018-07-23 (×5): 50 mg via ORAL
  Filled 2018-07-22 (×5): qty 1

## 2018-07-22 MED ORDER — ONDANSETRON HCL 4 MG PO TABS: 4.0000 mg | ORAL_TABLET | Freq: Four times a day (QID) | ORAL | Status: DC | PRN

## 2018-07-22 MED ORDER — ACETAMINOPHEN 325 MG PO TABS
650.0000 mg | ORAL_TABLET | Freq: Four times a day (QID) | ORAL | Status: DC | PRN
  Administered 2018-07-23: 650 mg via ORAL
  Filled 2018-07-22: qty 2

## 2018-07-22 MED ORDER — SODIUM CHLORIDE 0.9 % IV BOLUS (SEPSIS)
1000.0000 mL | Freq: Once | INTRAVENOUS | Status: AC
  Administered 2018-07-22: 1000 mL via INTRAVENOUS

## 2018-07-22 MED ORDER — SODIUM CHLORIDE 0.9 % IV SOLN
1.0000 g | INTRAVENOUS | Status: DC
  Administered 2018-07-23 – 2018-07-26 (×4): 1 g via INTRAVENOUS
  Filled 2018-07-22 (×4): qty 10

## 2018-07-22 MED ORDER — ONDANSETRON HCL 4 MG/2ML IJ SOLN: 4.0000 mg | Freq: Four times a day (QID) | INTRAMUSCULAR | Status: DC | PRN
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
## 2018-07-22 NOTE — ED Notes (Signed)
C/o pain with breathing and movement in back--
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
## 2018-07-22 NOTE — ED Triage Notes (Signed)
The pt reports that he was discharged from this ed yesterday 1530  He was seen for coughing.  He is having epigastric pain still and when he coughed when he arrived home he felt more pain in the epiogastric area and he vomited up dark blood
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
## 2018-07-22 NOTE — ED Notes (Signed)
The pt reports that he drinks alcohol almost every day

## 2018-07-22 NOTE — Progress Notes (Signed)
Patient got on the floor with no complains, he is placed on airborne precaution for possible tb. Results still pending.

## 2018-07-22 NOTE — ED Notes (Signed)
Pt went to c-t   Will start nss when he returns

## 2018-07-22 NOTE — ED Triage Notes (Addendum)
Pt transported from home by EMS for c/o worsening cough, R sided abd pain and worsening hemoptysis with clots (per EMS large clots noted).  Pt seen earlier for PNA, diminished lung sounds per EMS, T 101, 1 gm APAP given by EMS

## 2018-07-23 LAB — BASIC METABOLIC PANEL
Anion gap: 9 (ref 5–15)
BUN: 5 mg/dL — ABNORMAL LOW (ref 6–20)
CHLORIDE: 104 mmol/L (ref 98–111)
CO2: 24 mmol/L (ref 22–32)
Calcium: 9 mg/dL (ref 8.9–10.3)
Creatinine, Ser: 0.92 mg/dL (ref 0.61–1.24)
GFR calc Af Amer: >60 mL/min (ref >60)
GFR calc non Af Amer: >60 mL/min (ref >60)
Glucose, Bld: 110 mg/dL — ABNORMAL HIGH (ref 70–99)
Potassium: 3.6 mmol/L (ref 3.5–5.1)
Sodium: 137 mmol/L (ref 135–145)

## 2018-07-23 LAB — CBC
HCT: 44.5 % (ref 39.0–52.0)
Hemoglobin: 15 g/dL (ref 13.0–17.0)
MCH: 31.1 pg (ref 26.0–34.0)
MCHC: 33.7 g/dL (ref 30.0–36.0)
MCV: 92.3 fL (ref 80.0–100.0)
Platelets: 190 10*3/uL (ref 150–400)
RBC: 4.82 MIL/uL (ref 4.22–5.81)
RDW: 15.2 % (ref 11.5–15.5)
WBC: 8.9 10*3/uL (ref 4.0–10.5)
nRBC: 0 % (ref 0.0–0.2)

## 2018-07-23 LAB — PROCALCITONIN: Procalcitonin: <0.1 ng/mL

## 2018-07-23 LAB — HIV ANTIBODY (ROUTINE TESTING W REFLEX): HIV Screen 4th Generation wRfx: NONREACTIVE

## 2018-07-23 MED ORDER — ALBUTEROL SULFATE (2.5 MG/3ML) 0.083% IN NEBU
2.5000 mg | INHALATION_SOLUTION | Freq: Two times a day (BID) | RESPIRATORY_TRACT | Status: DC
  Administered 2018-07-23 – 2018-07-24 (×3): 2.5 mg via RESPIRATORY_TRACT
  Filled 2018-07-23 (×3): qty 3
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
## 2018-07-24 LAB — CBC WITH DIFFERENTIAL/PLATELET
Abs Immature Granulocytes: 0.02 10*3/uL (ref 0.00–0.07)
Basophils Absolute: 0 10*3/uL (ref 0.0–0.1)
Basophils Relative: 1 %
EOS ABS: 0.1 10*3/uL (ref 0.0–0.5)
EOS PCT: 1 %
HCT: 43.1 % (ref 39.0–52.0)
Hemoglobin: 14.7 g/dL (ref 13.0–17.0)
Immature Granulocytes: 0 %
Lymphocytes Relative: 34 %
Lymphs Abs: 2.4 10*3/uL (ref 0.7–4.0)
MCH: 31.3 pg (ref 26.0–34.0)
MCHC: 34.1 g/dL (ref 30.0–36.0)
MCV: 91.7 fL (ref 80.0–100.0)
Monocytes Absolute: 1 10*3/uL (ref 0.1–1.0)
Monocytes Relative: 15 %
Neutro Abs: 3.5 10*3/uL (ref 1.7–7.7)
Neutrophils Relative %: 49 %
Platelets: 211 10*3/uL (ref 150–400)
RBC: 4.7 MIL/uL (ref 4.22–5.81)
RDW: 14.6 % (ref 11.5–15.5)
WBC: 7.1 10*3/uL (ref 4.0–10.5)
nRBC: 0 % (ref 0.0–0.2)

## 2018-07-24 LAB — RAPID URINE DRUG SCREEN, HOSP PERFORMED
Amphetamines: NOT DETECTED
Barbiturates: NOT DETECTED
Benzodiazepines: NOT DETECTED
Cocaine: NOT DETECTED
Opiates: NOT DETECTED
Tetrahydrocannabinol: NOT DETECTED

## 2018-07-24 LAB — LEGIONELLA PNEUMOPHILA SEROGP 1 UR AG: L. PNEUMOPHILA SEROGP 1 UR AG: NEGATIVE

## 2018-07-24 LAB — SEDIMENTATION RATE: SED RATE: 30 mm/h — AB (ref 0–16)

## 2018-07-24 MED ORDER — PREDNISONE 20 MG PO TABS: 20.0000 mg | ORAL_TABLET | Freq: Every day | ORAL | 0 refills | Status: AC

## 2018-07-24 MED ORDER — CEPHALEXIN 500 MG PO CAPS: 500.0000 mg | ORAL_CAPSULE | Freq: Four times a day (QID) | ORAL | 0 refills | Status: DC

## 2018-07-24 MED ORDER — AZITHROMYCIN 250 MG PO TABS: 250.0000 mg | ORAL_TABLET | Freq: Every day | ORAL | 0 refills | Status: AC

## 2018-07-24 MED FILL — CEPHALEXIN 500 MG CAPSULE: 500 | 5 days supply | Qty: 20 | Fill #0

## 2018-07-24 MED FILL — predniSONE 20 MG TABS: 20 | 5 days supply | Qty: 5 | Fill #0

## 2018-07-24 MED FILL — AZITHROMYCIN 250 MG TABLET: 250 | 5 days supply | Qty: 6 | Fill #0
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
## 2018-07-24 NOTE — Plan of Care (Signed)
  Problem: Education: Goal: Knowledge of General Education information will improve Description Including pain rating scale, medication(s)/side effects and non-pharmacologic comfort measures Outcome: Progressing Note:  POC reviewed with pt., and asked questions about bronchoscopy.

## 2018-07-24 NOTE — Progress Notes (Signed)
Spoke with [facility] at [phone] about pts [employer] Resources[employer]eferon Gold Specimen. She states that although it was drawn on the 21st., they received it the 22nd, and they are 2 days behind, so it "could be a few days after Christmas before they get the results of the Linna DarnerQuanteferon Gold test back." Will page attending MD to let her know. Will continue to monitor closely.  Sean HeinzMelissa Khaled Herda, RN

## 2018-07-24 NOTE — Care Management Note (Addendum)
Case Management Note  Patient Details  Name: Sean [name] No MRN: [id number] Date of Birth: 03/13/1991  Subjective/Objective:                    Action/[name]:  Spoke with Attending patient is [name] possible discharge today. MD will [facility] to have them filled today by 5 pm. [name] and will [facility]. [facility]   TOC will bring medications to bedside prior to discharge.  Patient [name] at 0930 am .  All of above explained to patient. Patient voiced understanding. Expected Discharge Date:  07/25/18               Expected Discharge [name]:  Home/Self Care  In-House Referral:  Financial Counselor  Discharge planning Services  CM Consult, Medication Assistance, MATCH Program, Indigent Health Clinic  Post Acute Care Choice:  NA Choice offered to:     DME Arranged:  N/[name] DME Agency:  NA  HH Arranged:  NA HH Agency:  NA  Status of Service:  In process, will continue to follow  If discussed at Long Length of Stay Meetings, dates discussed:    Additional Comments:  Sean PlanWile, Sean Bade Marie, RN 07/24/2018, 4:10 PM

## 2018-07-24 NOTE — Progress Notes (Signed)
Call from Dr Irene Pap - [facility] - pccm signed off yesterday. Case reviewed  - given normal PCT and RVP and hiv negative - she asked patient about vaping - denies   rec  - UDS - - ana, gbm and anca - call pccm if abnormal\  - otherwise pccm will not round as inpatient -c an go h ome on broad abx - total 7d - will need CT followup as opd - see pulmonary first Future Appointments  Date Time Provider [place]  08/08/2018  9:30 AM Elsie Stain, MD [facility] None  08/14/2018 10:00 AM Fenton Foy, NP [facility] None     SIGNATURE    Dr. Brand Males, M.D., F.C.C.P,  Pulmonary and Critical Care Medicine Staff Physician, [place] Director - Interstitial Lung Disease  Program  P[facility] [place] [facility] [place], [place], [address]  Pager: [phone], If no answer or between  15:00h - 7:00h: call [phone] Telephone: [phone]  4:33 PM 07/24/2018

## 2018-07-25 LAB — MPO/PR-3 (ANCA) ANTIBODIES
ANCA Proteinase 3: <3.5 U/mL (ref 0.0–3.5)
Myeloperoxidase Abs: <9 U/mL (ref 0.0–9.0)

## 2018-07-25 LAB — ACID FAST SMEAR (AFB, MYCOBACTERIA): Acid Fast Smear: NEGATIVE

## 2018-07-25 LAB — BASIC METABOLIC PANEL
Anion gap: 10 (ref 5–15)
BUN: 12 mg/dL (ref 6–20)
CHLORIDE: 105 mmol/L (ref 98–111)
CO2: 23 mmol/L (ref 22–32)
Calcium: 9 mg/dL (ref 8.9–10.3)
Creatinine, Ser: 0.98 mg/dL (ref 0.61–1.24)
GFR calc Af Amer: >60 mL/min (ref >60)
GFR calc non Af Amer: >60 mL/min (ref >60)
GLUCOSE: 86 mg/dL (ref 70–99)
Potassium: 3.5 mmol/L (ref 3.5–5.1)
Sodium: 138 mmol/L (ref 135–145)

## 2018-07-25 LAB — CBC WITH DIFFERENTIAL/PLATELET
Abs Immature Granulocytes: 0.02 10*3/uL (ref 0.00–0.07)
Basophils Absolute: 0.1 10*3/uL (ref 0.0–0.1)
Basophils Relative: 1 %
Eosinophils Absolute: 0.1 10*3/uL (ref 0.0–0.5)
Eosinophils Relative: 1 %
HEMATOCRIT: 44.3 % (ref 39.0–52.0)
Hemoglobin: 14.6 g/dL (ref 13.0–17.0)
Immature Granulocytes: 0 %
LYMPHS ABS: 2.5 10*3/uL (ref 0.7–4.0)
LYMPHS PCT: 36 %
MCH: 30.2 pg (ref 26.0–34.0)
MCHC: 33 g/dL (ref 30.0–36.0)
MCV: 91.7 fL (ref 80.0–100.0)
Monocytes Absolute: 0.9 10*3/uL (ref 0.1–1.0)
Monocytes Relative: 13 %
Neutro Abs: 3.4 10*3/uL (ref 1.7–7.7)
Neutrophils Relative %: 49 %
Platelets: 228 10*3/uL (ref 150–400)
RBC: 4.83 MIL/uL (ref 4.22–5.81)
RDW: 14.4 % (ref 11.5–15.5)
WBC: 6.9 10*3/uL (ref 4.0–10.5)
nRBC: 0 % (ref 0.0–0.2)

## 2018-07-25 LAB — GLOMERULAR BASEMENT MEMBRANE ANTIBODIES: GBM Ab: 2 units (ref 0–20)

## 2018-07-25 LAB — PROCALCITONIN: Procalcitonin: <0.1 ng/mL

## 2018-07-25 LAB — ANTINUCLEAR ANTIBODIES, IFA: ANA Ab, IFA: NEGATIVE

## 2018-07-25 NOTE — Plan of Care (Signed)
  Problem: Health Behavior/Discharge Planning: Goal: Ability to manage health-related needs will improve Outcome: Adequate for Discharge   Problem: Clinical Measurements: Goal: Ability to maintain clinical measurements within normal limits will improve Outcome: Adequate for Discharge Goal: Will remain free from infection Outcome: Adequate for Discharge Goal: Diagnostic test results will improve Outcome: Adequate for Discharge Goal: Respiratory complications will improve Outcome: Adequate for Discharge Goal: Cardiovascular complication will be avoided Outcome: Adequate for Discharge   Problem: Activity: Goal: Risk for activity intolerance will decrease Outcome: Adequate for Discharge   Problem: Nutrition: Goal: Adequate nutrition will be maintained Outcome: Adequate for Discharge   Problem: Coping: Goal: Level of anxiety will decrease Outcome: Adequate for Discharge   Problem: Elimination: Goal: Will not experience complications related to bowel motility Outcome: Adequate for Discharge Goal: Will not experience complications related to urinary retention Outcome: Adequate for Discharge   Problem: Safety: Goal: Ability to remain free from injury will improve Outcome: Adequate for Discharge   Problem: Skin Integrity: Goal: Risk for impaired skin integrity will decrease Outcome: Adequate for Discharge   
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
## 2018-07-25 NOTE — Progress Notes (Signed)
Patient has removed telemetry box. Pt states he will not wear it anymore. Text page sent to Dr. Sharolyn DouglasEzenduka.  Will continue to monitor.  - Alwyn RenJamie Dashawn Bartnick RN

## 2018-07-26 MED ORDER — AZITHROMYCIN 600 MG PO TABS: 600.0000 mg | ORAL_TABLET | Freq: Every day | ORAL | Status: DC

## 2018-07-26 NOTE — Plan of Care (Signed)
  Problem: Activity: Goal: Risk for activity intolerance will decrease Outcome: Progressing   Problem: Elimination: Goal: Will not experience complications related to bowel motility Outcome: Progressing   Problem: Safety: Goal: Ability to remain free from injury will improve Outcome: Progressing   

## 2018-07-26 NOTE — Discharge Instructions (Signed)
Cough, Adult  A cough helps to clear your throat and lungs. A cough may last only 2-3 weeks (acute), or it may last longer than 8 weeks (chronic). Many different things can cause a cough. A cough may be a sign of an illness or another medical condition. Follow these instructions at home:  Pay attention to any changes in your cough.  Take medicines only as told by your doctor. ? If you were prescribed an antibiotic medicine, take it as told by your doctor. Do not stop taking it even if you start to feel better. ? Talk with your doctor before you try using a cough medicine.  Drink enough fluid to keep your pee (urine) clear or pale yellow.  If the air is dry, use a cold steam vaporizer or humidifier in your home.  Stay away from things that make you cough at work or at home.  If your cough is worse at night, try using extra pillows to raise your head up higher while you sleep.  Do not smoke, and try not to be around smoke. If you need help quitting, ask your doctor.  Do not have caffeine.  Do not drink alcohol.  Rest as needed. Contact a doctor if:  You have new problems (symptoms).  You cough up yellow fluid (pus).  Your cough does not get better after 2-3 weeks, or your cough gets worse.  Medicine does not help your cough and you are not sleeping well.  You have pain that gets worse or pain that is not helped with medicine.  You have a fever.  You are losing weight and you do not know why.  You have night sweats. Get help right away if:  You cough up blood.  You have trouble breathing.  Your heartbeat is very fast. This information is not intended to replace advice given to you by your health care provider. Make sure you discuss any questions you have with your health care provider. Document Released: 04/01/2011 Document Revised: 12/25/2015 Document Reviewed: 09/25/2014 Elsevier Interactive Patient Education  2019 Elsevier Inc.   Community-Acquired Pneumonia,  Adult Pneumonia is an infection of the lungs. It causes swelling in the airways of the lungs. Mucus and fluid may also build up inside the airways. One type of pneumonia can happen while a person is in a hospital. A different type can happen when a person is not in a hospital (community-acquired pneumonia).  What are the causes?  This condition is caused by germs (viruses, bacteria, or fungi). Some types of germs can be passed from one person to another. This can happen when you breathe in droplets from the cough or sneeze of an infected person. What increases the risk? You are more likely to develop this condition if you:  Have a long-term (chronic) disease, such as: ? Chronic obstructive pulmonary disease (COPD). ? Asthma. ? Cystic fibrosis. ? Congestive heart failure. ? Diabetes. ? Kidney disease.  Have HIV.  Have sickle cell disease.  Have had your spleen removed.  Do not take good care of your teeth and mouth (poor dental hygiene).  Have a medical condition that increases the risk of breathing in droplets from your own mouth and nose.  Have a weakened body defense system (immune system).  Are a smoker.  Travel to areas where the germs that cause this illness are common.  Are around certain animals or the places they live. What are the signs or symptoms?  A dry cough.  A wet (productive)  cough.  Fever.  Sweating.  Chest pain. This often happens when breathing deeply or coughing.  Fast breathing or trouble breathing.  Shortness of breath.  Shaking chills.  Feeling tired (fatigue).  Muscle aches. How is this treated? Treatment for this condition depends on many things. Most adults can be treated at home. In some cases, treatment must happen in a hospital. Treatment may include:  Medicines given by mouth or through an IV tube.  Being given extra oxygen.  Respiratory therapy. In rare cases, treatment for very bad pneumonia may include:  Using a machine  to help you breathe.  Having a procedure to remove fluid from around your lungs. Follow these instructions at home: Medicines  Take over-the-counter and prescription medicines only as told by your doctor. ? Only take cough medicine if you are losing sleep.  If you were prescribed an antibiotic medicine, take it as told by your doctor. Do not stop taking the antibiotic even if you start to feel better. General instructions   Sleep with your head and neck raised (elevated). You can do this by sleeping in a recliner or by putting a few pillows under your head.  Rest as needed. Get at least 8 hours of sleep each night.  Drink enough water to keep your pee (urine) pale yellow.  Eat a healthy diet that includes plenty of vegetables, fruits, whole grains, low-fat dairy products, and lean protein.  Do not use any products that contain nicotine or tobacco. These include cigarettes, e-cigarettes, and chewing tobacco. If you need help quitting, ask your doctor.  Keep all follow-up visits as told by your doctor. This is important. How is this prevented? A shot (vaccine) can help prevent pneumonia. Shots are often suggested for:  People older than 28 years of age.  People older than 28 years of age who: ? Are having cancer treatment. ? Have long-term (chronic) lung disease. ? Have problems with their body's defense system. You may also prevent pneumonia if you take these actions:  Get the flu (influenza) shot every year.  Go to the dentist as often as told.  Wash your hands often. If you cannot use soap and water, use hand sanitizer. Contact a doctor if:  You have a fever.  You lose sleep because your cough medicine does not help. Get help right away if:  You are short of breath and it gets worse.  You have more chest pain.  Your sickness gets worse. This is very serious if: ? You are an older adult. ? Your body's defense system is weak.  You cough up  blood. Summary  Pneumonia is an infection of the lungs.  Most adults can be treated at home. Some will need treatment in a hospital.  Drink enough water to keep your pee pale yellow.  Get at least 8 hours of sleep each night. This information is not intended to replace advice given to you by your health care provider. Make sure you discuss any questions you have with your health care provider. Document Released: 01/05/2008 Document Revised: 03/16/2018 Document Reviewed: 03/16/2018 Elsevier Interactive Patient Education  2019 [employer]

## 2018-07-27 LAB — CULTURE, BLOOD (ROUTINE X 2)
CULTURE: NO GROWTH
Culture: NO GROWTH
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
## 2018-08-03 LAB — QUANTIFERON-TB GOLD PLUS (RQFGPL)
QuantiFERON Mitogen Value: >10 IU/mL
QuantiFERON Nil Value: 0.04 IU/mL
QuantiFERON TB1 Ag Value: 0.04 IU/mL
QuantiFERON TB2 Ag Value: 0.06 IU/mL

## 2018-08-03 LAB — QUANTIFERON-TB GOLD PLUS: QuantiFERON-TB Gold Plus: NEGATIVE

## 2018-08-08 ENCOUNTER — Inpatient Hospital Stay: Payer: Self-pay | Admitting: Critical Care Medicine

## 2018-08-14 ENCOUNTER — Inpatient Hospital Stay: Payer: Self-pay | Admitting: Nurse Practitioner

## 2018-08-15 ENCOUNTER — Inpatient Hospital Stay: Payer: Self-pay | Admitting: Nurse Practitioner

## 2018-08-22 ENCOUNTER — Encounter: Payer: Self-pay | Admitting: Critical Care Medicine

## 2018-08-22 ENCOUNTER — Ambulatory Visit: Payer: Self-pay | Attending: Critical Care Medicine | Admitting: Critical Care Medicine

## 2018-08-22 VITALS — BP 149/94 | HR 86 | Temp 98.6°F | Ht 72.0 in | Wt 271.0 lb

## 2018-08-22 DIAGNOSIS — J189 Pneumonia, unspecified organism: Secondary | ICD-10-CM

## 2018-08-22 DIAGNOSIS — Z6836 Body mass index (BMI) 36.0-36.9, adult: Secondary | ICD-10-CM | POA: Insufficient documentation

## 2018-08-22 DIAGNOSIS — F17210 Nicotine dependence, cigarettes, uncomplicated: Secondary | ICD-10-CM | POA: Insufficient documentation

## 2018-08-22 DIAGNOSIS — Z833 Family history of diabetes mellitus: Secondary | ICD-10-CM | POA: Insufficient documentation

## 2018-08-22 DIAGNOSIS — I10 Essential (primary) hypertension: Secondary | ICD-10-CM

## 2018-08-22 DIAGNOSIS — Z720 Tobacco use: Secondary | ICD-10-CM

## 2018-08-22 DIAGNOSIS — R042 Hemoptysis: Secondary | ICD-10-CM

## 2018-08-22 DIAGNOSIS — Z79899 Other long term (current) drug therapy: Secondary | ICD-10-CM | POA: Insufficient documentation

## 2018-08-22 DIAGNOSIS — E669 Obesity, unspecified: Secondary | ICD-10-CM | POA: Insufficient documentation

## 2018-08-22 MED ORDER — AMLODIPINE BESYLATE 5 MG PO TABS: 10.0000 mg | ORAL_TABLET | Freq: Every day | ORAL | 6 refills | Status: AC

## 2018-08-22 NOTE — Patient Instructions (Addendum)
Begin amlodipine 1 pill daily  No additional antibiotics are indicated  Follow a low-salt diet see below  Return in 1 month to establish with primary care   Managing Your Hypertension Hypertension is commonly called high blood pressure. This is when the force of your blood pressing against the walls of your arteries is too strong. Arteries are blood vessels that carry blood from your heart throughout your body. Hypertension forces the heart to work harder to pump blood, and may cause the arteries to become narrow or stiff. Having untreated or uncontrolled hypertension can cause heart attack, stroke, kidney disease, and other problems. What are blood pressure readings? A blood pressure reading consists of a higher number over a lower number. Ideally, your blood pressure should be below 120/80. The first ("top") number is called the systolic pressure. It is a measure of the pressure in your arteries as your heart beats. The second ("bottom") number is called the diastolic pressure. It is a measure of the pressure in your arteries as the heart relaxes. What does my blood pressure reading mean? Blood pressure is classified into four stages. Based on your blood pressure reading, your health care provider may use the following stages to determine what type of treatment you need, if any. Systolic pressure and diastolic pressure are measured in a unit called mm Hg. Normal  Systolic pressure: below 120.  Diastolic pressure: below 80. Elevated  Systolic pressure: 120-129.  Diastolic pressure: below 80. Hypertension stage 1  Systolic pressure: 130-139.  Diastolic pressure: 80-89. Hypertension stage 2  Systolic pressure: 140 or above.  Diastolic pressure: 90 or above. What health risks are associated with hypertension? Managing your hypertension is an important responsibility. Uncontrolled hypertension can lead to:  A heart attack.  A stroke.  A weakened blood vessel  (aneurysm).  Heart failure.  Kidney damage.  Eye damage.  Metabolic syndrome.  Memory and concentration problems. What changes can I make to manage my hypertension? Hypertension can be managed by making lifestyle changes and possibly by taking medicines. Your health care provider will help you make a plan to bring your blood pressure within a normal range. Eating and drinking   Eat a diet that is high in fiber and potassium, and low in salt (sodium), added sugar, and fat. An example eating plan is called the DASH (Dietary Approaches to Stop Hypertension) diet. To eat this way: ? Eat plenty of fresh fruits and vegetables. Try to fill half of your plate at each meal with fruits and vegetables. ? Eat whole grains, such as whole wheat pasta, brown rice, or whole grain bread. Fill about one quarter of your plate with whole grains. ? Eat low-fat diary products. ? Avoid fatty cuts of meat, processed or cured meats, and poultry with skin. Fill about one quarter of your plate with lean proteins such as fish, chicken without skin, beans, eggs, and tofu. ? Avoid premade and processed foods. These tend to be higher in sodium, added sugar, and fat.  Reduce your daily sodium intake. Most people with hypertension should eat less than 1,500 mg of sodium a day.  Limit alcohol intake to no more than 1 drink a day for nonpregnant women and 2 drinks a day for men. One drink equals 12 oz of beer, 5 oz of wine, or 1 oz of hard liquor. Lifestyle  Work with your health care provider to maintain a healthy body weight, or to lose weight. Ask what an ideal weight is for you.  Get  at least 30 minutes of exercise that causes your heart to beat faster (aerobic exercise) most days of the week. Activities may include walking, swimming, or biking.  Include exercise to strengthen your muscles (resistance exercise), such as weight lifting, as part of your weekly exercise routine. Try to do these types of exercises for  30 minutes at least 3 days a week.  Do not use any products that contain nicotine or tobacco, such as cigarettes and e-cigarettes. If you need help quitting, ask your health care provider.  Control any long-term (chronic) conditions you have, such as high cholesterol or diabetes. Monitoring  Monitor your blood pressure at home as told by your health care provider. Your personal target blood pressure may vary depending on your medical conditions, your age, and other factors.  Have your blood pressure checked regularly, as often as told by your health care provider. Working with your health care provider  Review all the medicines you take with your health care provider because there may be side effects or interactions.  Talk with your health care provider about your diet, exercise habits, and other lifestyle factors that may be contributing to hypertension.  Visit your health care provider regularly. Your health care provider can help you create and adjust your plan for managing hypertension. Will I need medicine to control my blood pressure? Your health care provider may prescribe medicine if lifestyle changes are not enough to get your blood pressure under control, and if:  Your systolic blood pressure is 130 or higher.  Your diastolic blood pressure is 80 or higher. Take medicines only as told by your health care provider. Follow the directions carefully. Blood pressure medicines must be taken as prescribed. The medicine does not work as well when you skip doses. Skipping doses also puts you at risk for problems. Contact a health care provider if:  You think you are having a reaction to medicines you have taken.  You have repeated (recurrent) headaches.  You feel dizzy.  You have swelling in your ankles.  You have trouble with your vision. Get help right away if:  You develop a severe headache or confusion.  You have unusual weakness or numbness, or you feel faint.  You have  severe pain in your chest or abdomen.  You vomit repeatedly.  You have trouble breathing. Summary  Hypertension is when the force of blood pumping through your arteries is too strong. If this condition is not controlled, it may put you at risk for serious complications.  Your personal target blood pressure may vary depending on your medical conditions, your age, and other factors. For most people, a normal blood pressure is less than 120/80.  Hypertension is managed by lifestyle changes, medicines, or both. Lifestyle changes include weight loss, eating a healthy, low-sodium diet, exercising more, and limiting alcohol. This information is not intended to replace advice given to you by your health care provider. Make sure you discuss any questions you have with your health care provider. Document Released: 04/12/2012 Document Revised: 06/16/2016 Document Reviewed: 06/16/2016 Elsevier Interactive Patient Education  2019 [employer]

## 2018-08-22 NOTE — Assessment & Plan Note (Signed)
Ongoing tobacco use  Smoking cessation counseling was discussed with the patient
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
## 2018-08-22 NOTE — Assessment & Plan Note (Signed)
Community-acquired pneumonia bilateral lower lobes now resolved on exam  No additional antibiotics indicated  No additional pulmonary work-up indicated

## 2018-08-22 NOTE — Assessment & Plan Note (Signed)
History of hemoptysis now resolved  We will focus on smoking cessation

## 2018-08-22 NOTE — Assessment & Plan Note (Signed)
Ongoing hypertension which upon review of the chart is been developing over the past 4 years for this patient.  I discussed with the patient a- hypertension diet with low salt intake  Add amlodipine 5 mg daily

## 2018-09-06 LAB — ACID FAST CULTURE WITH REFLEXED SENSITIVITIES (MYCOBACTERIA)

## 2018-09-06 LAB — ACID FAST CULTURE WITH REFLEXED SENSITIVITIES: ACID FAST CULTURE - AFSCU3: NEGATIVE

## 2018-09-22 ENCOUNTER — Ambulatory Visit: Payer: Self-pay | Admitting: Family Medicine

## 2020-01-14 ENCOUNTER — Ambulatory Visit (HOSPITAL_COMMUNITY): Payer: Self-pay | Admitting: Psychiatry

## 2020-01-14 ENCOUNTER — Ambulatory Visit (HOSPITAL_COMMUNITY): Payer: Self-pay | Admitting: Licensed Clinical Social Worker

## 2020-08-28 IMAGING — DX DG CHEST 2V
2 series · 2 of 2 positions shown · non-contrast
Comparison: None.

CLINICAL DATA: Productive cough

EXAM:
CHEST - 2 VIEW

[chest pa]
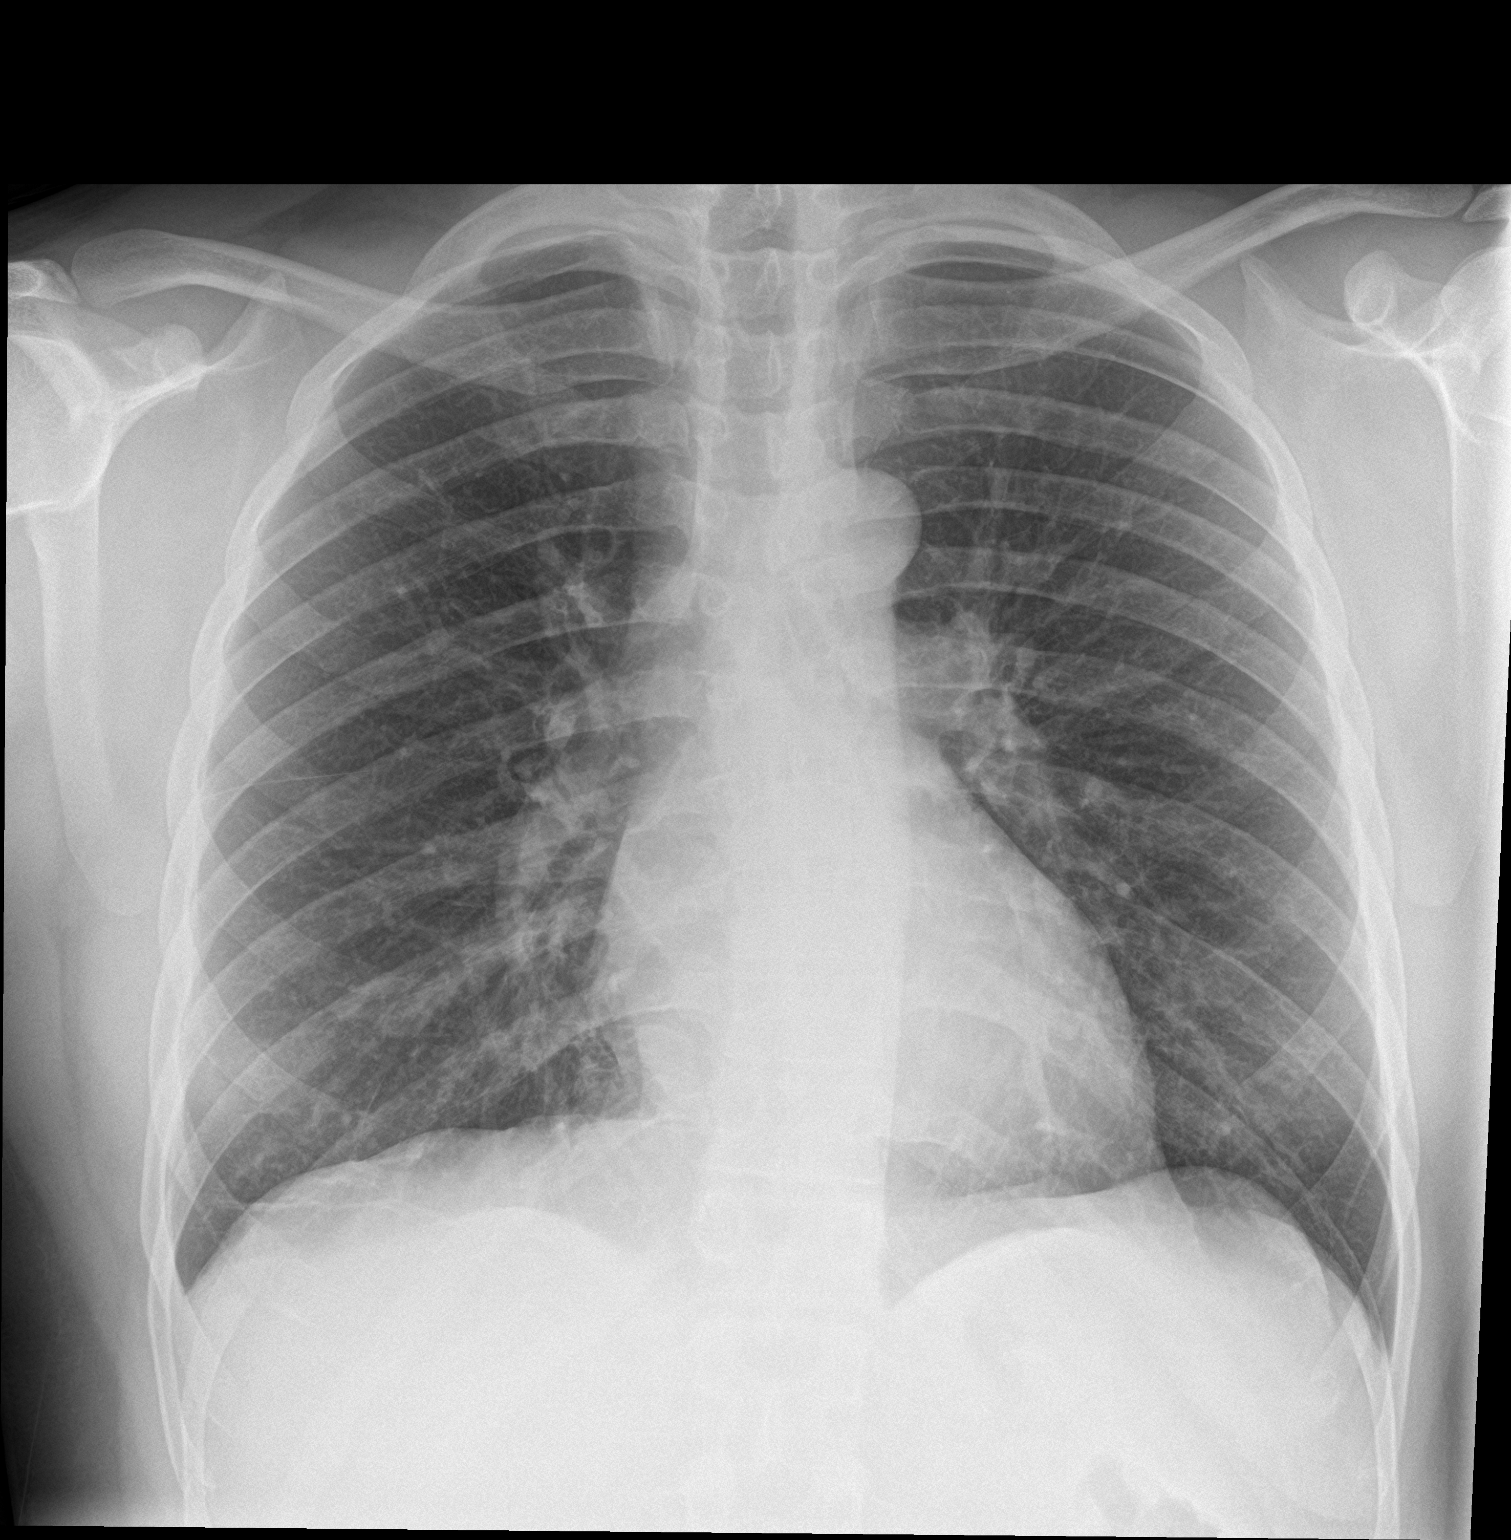

[chest lat]
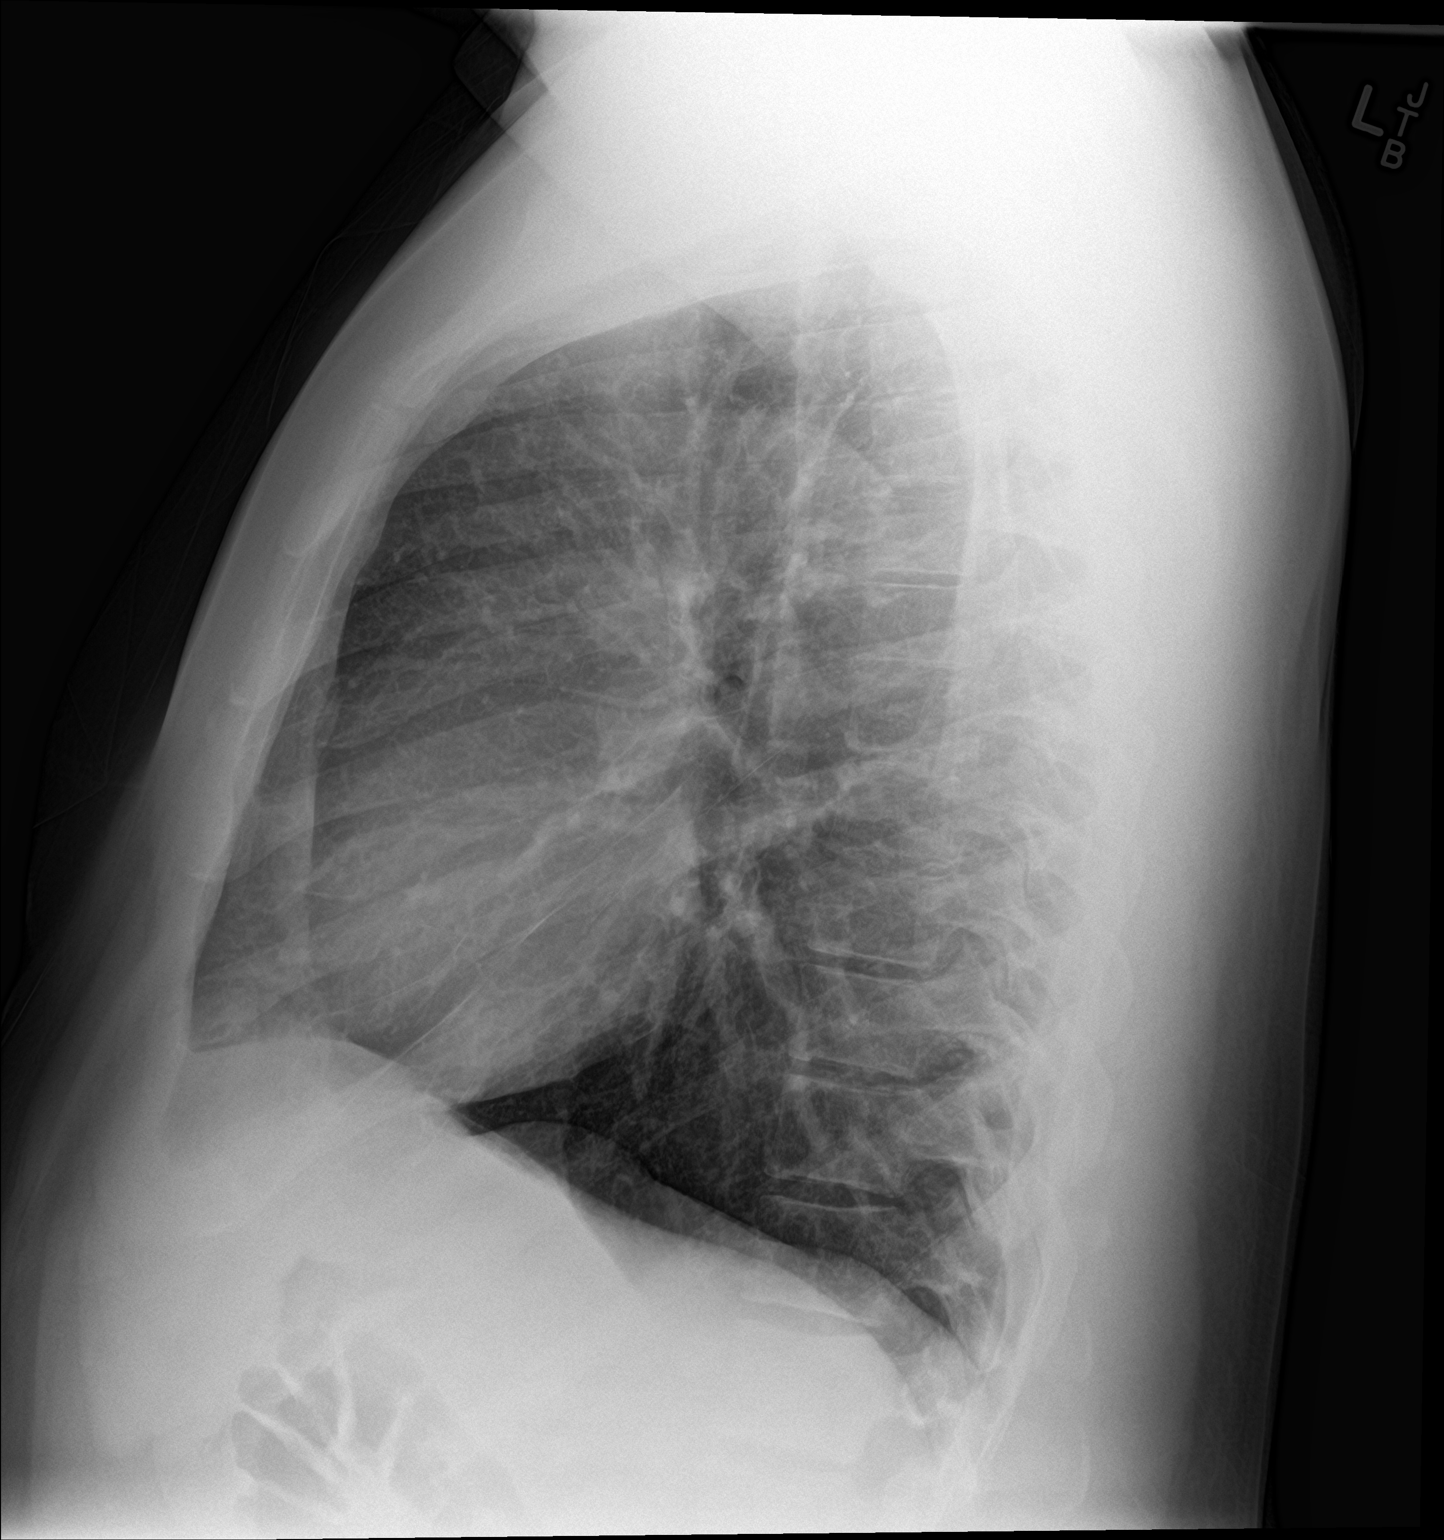

[2 of 2 positions shown; findings below may reference images not displayed]

FINDINGS: There is slight atelectasis in the right base. The lungs elsewhere
are clear. Heart size and pulmonary vascularity are normal. No
adenopathy. No bone lesions.
IMPRESSION: Slight right base atelectasis. No edema or consolidation. No
adenopathy.

## 2020-08-29 IMAGING — CT CT ANGIO CHEST
2 of 6 series · 18 of 36 positions shown · IV contrast (iopamidol)
Comparison: Chest radiograph performed 07/21/2018

CLINICAL DATA: Acute onset of hemoptysis.

EXAM:
CT ANGIOGRAPHY CHEST WITH CONTRAST
TECHNIQUE: Multidetector CT imaging of the chest was performed using the
standard protocol during bolus administration of intravenous
contrast. Multiplanar CT image reconstructions and MIPs were
obtained to evaluate the vascular anatomy.
CONTRAST:  85mL QKT60D-64G IOPAMIDOL (QKT60D-64G) INJECTION 76%

[Series 7: pe thins · axial · 0.78mm/px · z∈[+1270,+1557]mm · 17 of 456 slices shown]
[im 23/456  lung]
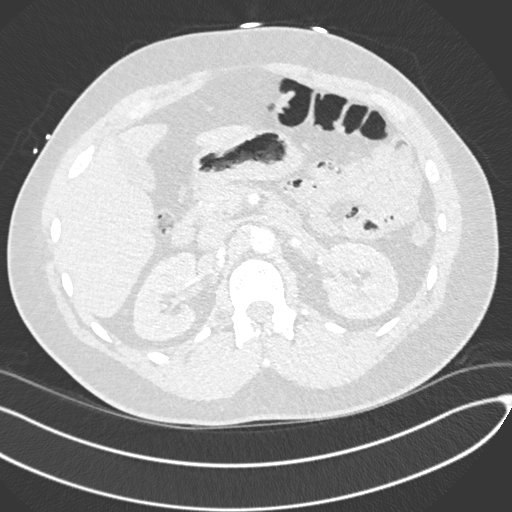
[im 46/456  mediastinal]
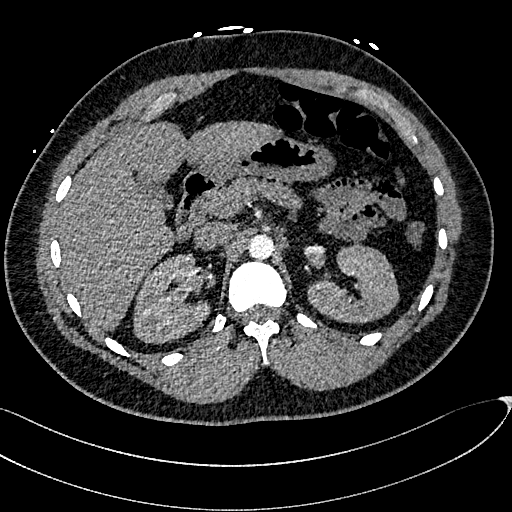
[im 69/456  lung]
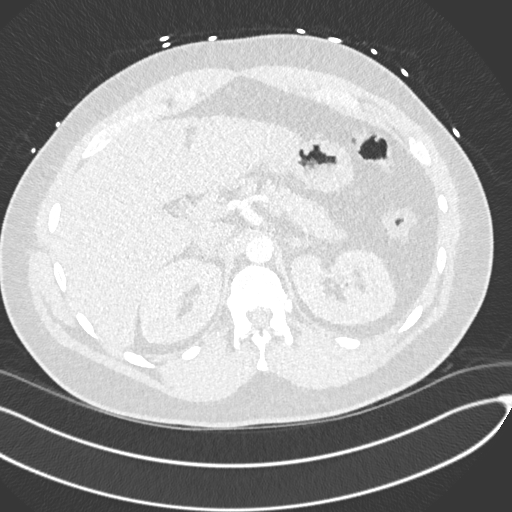
[im 92/456  mediastinal]
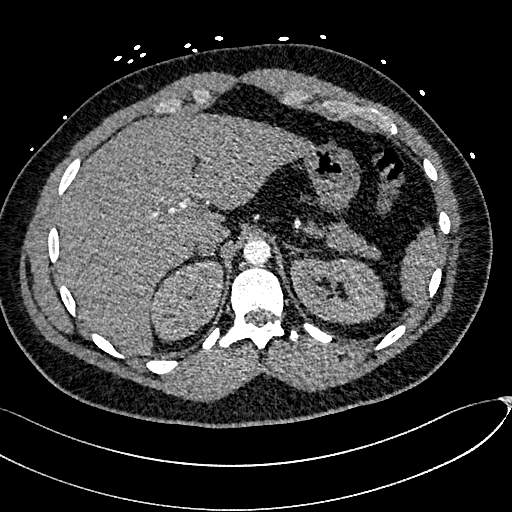
[im 137/456  lung]
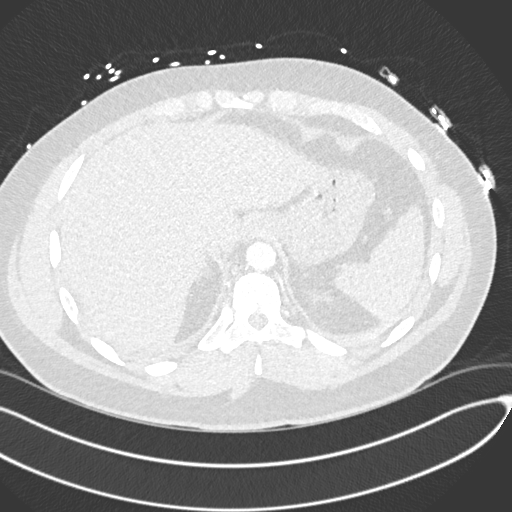
[im 160/456  mediastinal]
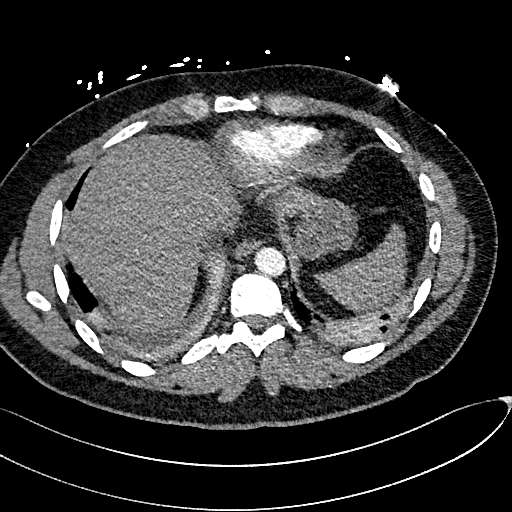
[im 183/456  lung]
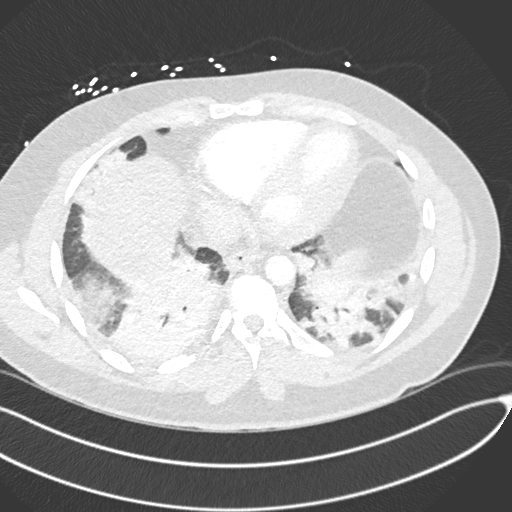
[im 205/456  mediastinal]
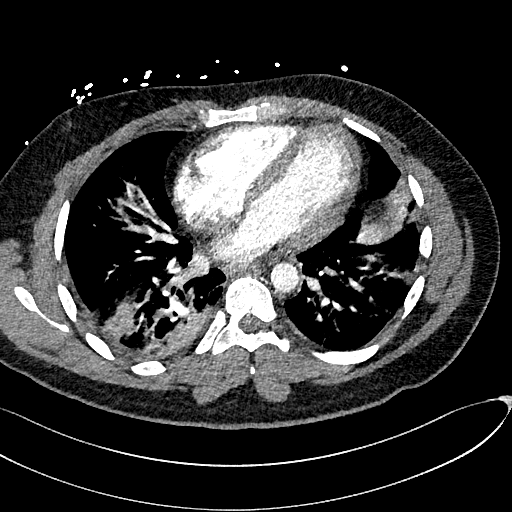
[im 228/456  lung]
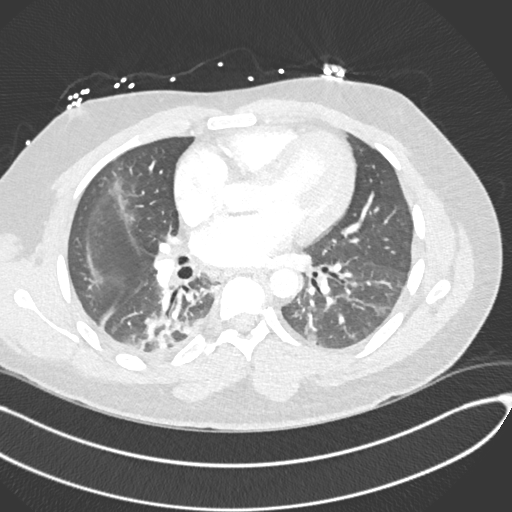
[im 251/456  mediastinal]
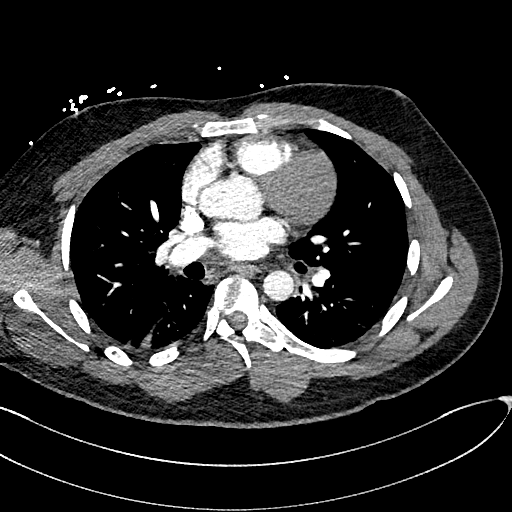
[im 274/456  lung]
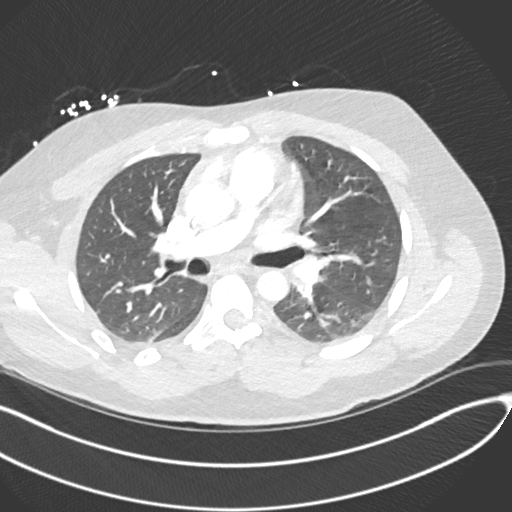
[im 296/456  mediastinal]
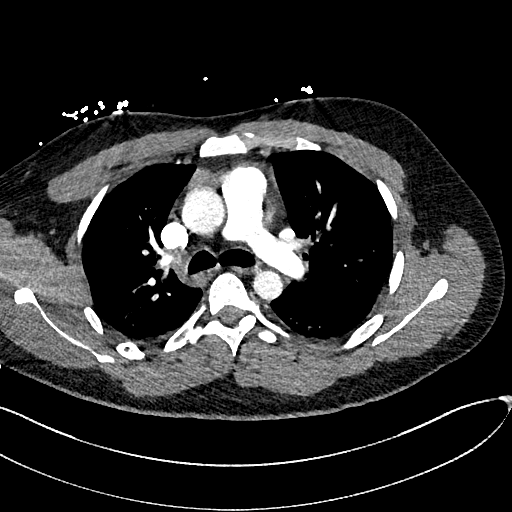
[im 319/456  lung]
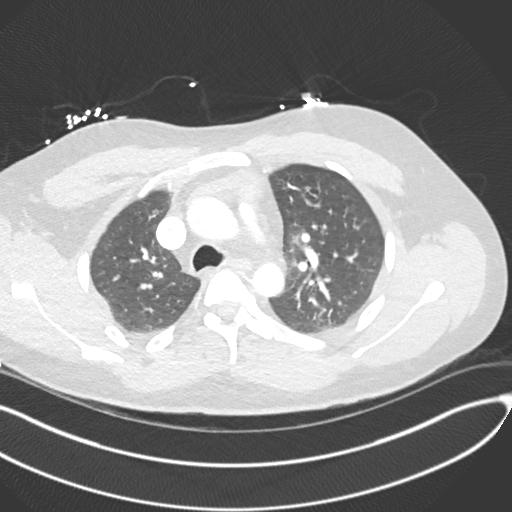
[im 365/456  mediastinal]
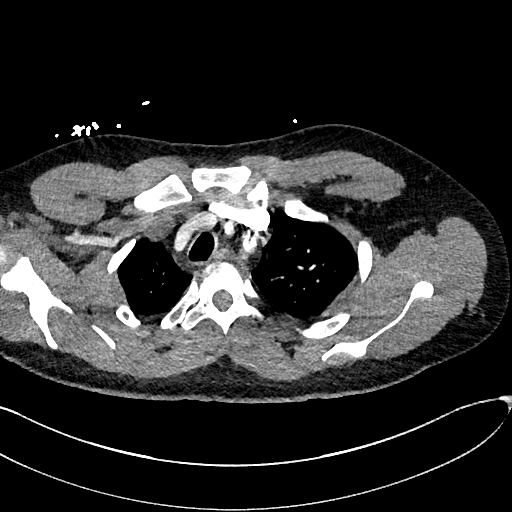
[im 387/456  lung]
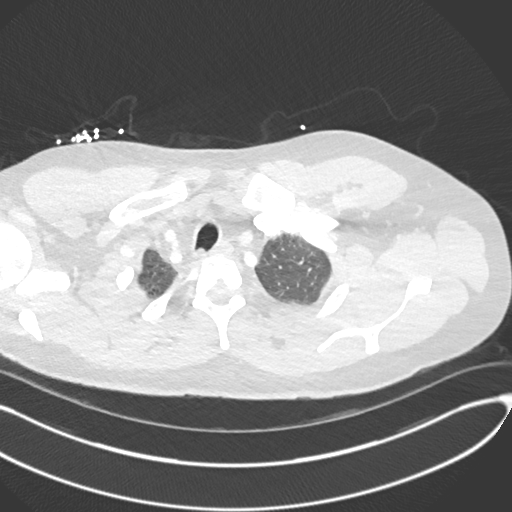
[im 410/456  mediastinal]
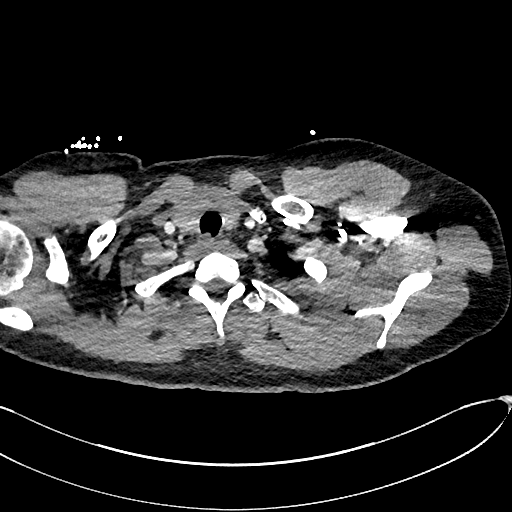
[im 433/456  lung]
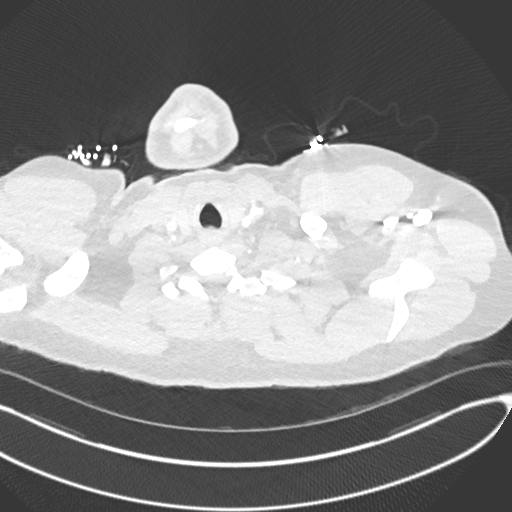

[Series 8: pe 2mm cor · coronal · 0.62mm/px · 1 of 146 slices shown]
[im 73/146  mediastinal]
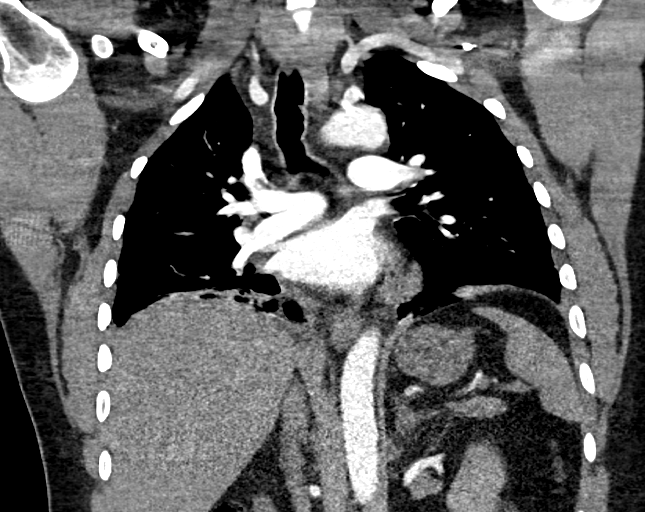

[18 of 36 positions shown; findings below may reference images not displayed]

FINDINGS: Cardiovascular:  There is no evidence of pulmonary embolus.

The heart is normal in size. The thoracic aorta is unremarkable. The
great vessels are within normal limits.

Mediastinum/Nodes: The mediastinum is unremarkable in appearance. No
mediastinal lymphadenopathy is seen. No pericardial effusion is
identified. The visualized portions of the thyroid gland are
unremarkable. No axillary lymphadenopathy is seen.

Mild soft tissue density thickening at the left side of the proximal
trachea may reflect debris. However, if the patient's symptoms
persist, bronchoscopy would be helpful to assess for underlying
mass.

Lungs/Pleura: Patchy airspace opacity is noted at the lower lobes
bilaterally. This may reflect pneumonia, or possibly aspiration of
blood given the patient's hemoptysis. Trace bilateral pleural
effusions are noted. No pneumothorax is identified. No masses are
seen.

Upper Abdomen: The visualized portions of the liver and spleen are
unremarkable. The visualized portions of the gallbladder, pancreas,
adrenal glands and kidneys are within normal limits.

Musculoskeletal: No acute osseous abnormalities are identified. The
visualized musculature is unremarkable in appearance.

Review of the MIP images confirms the above findings.
IMPRESSION: 1. No evidence of pulmonary embolus.
2. Patchy airspace opacity at the lower lobes bilaterally, raising
question for pneumonia. Alternatively, this could reflect aspiration
of blood given the patient's hemoptysis.
3. Trace bilateral pleural effusions noted.
4. Mild soft tissue density thickening at the left side of the
proximal trachea may reflect debris. However, if the patient's
hemoptysis persists, bronchoscopy would be helpful to assess for
underlying mass.

## 2020-11-17 ENCOUNTER — Encounter (HOSPITAL_COMMUNITY): Payer: Self-pay

## 2020-11-17 ENCOUNTER — Ambulatory Visit (HOSPITAL_COMMUNITY)
Admission: EM | Admit: 2020-11-17 | Discharge: 2020-11-17 | Disposition: A | Discharge status: Home / Self Care | Payer: Self-pay | Attending: Emergency Medicine | Admitting: Emergency Medicine

## 2020-11-17 ENCOUNTER — Other Ambulatory Visit: Payer: Self-pay

## 2020-11-17 DIAGNOSIS — K047 Periapical abscess without sinus: Secondary | ICD-10-CM

## 2020-11-17 MED ORDER — CLINDAMYCIN HCL 150 MG PO CAPS: 450.0000 mg | ORAL_CAPSULE | Freq: Three times a day (TID) | ORAL | 0 refills | Status: AC
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
## 2020-11-17 NOTE — Discharge Instructions (Addendum)
Take 3 pills 3 times a day with food for the next 7 days  Use salt water gargles, ibuprofen, chloraseptic spray, to help with sore throat  Can continue use of benadryl or Claritin for allergy symptoms  Primary care assistance will reach out to you to help find a doctor

## 2020-11-17 NOTE — ED Triage Notes (Signed)
Pt with c/o sore throat, filling out of tooth, congestion, sneezing.

## 2020-11-19 ENCOUNTER — Encounter (HOSPITAL_COMMUNITY): Payer: Self-pay

## 2020-11-19 ENCOUNTER — Emergency Department (HOSPITAL_COMMUNITY)
Admission: EM | Admit: 2020-11-19 | Discharge: 2020-11-20 | Disposition: A | Discharge status: Home / Self Care | Payer: Self-pay | Attending: Emergency Medicine | Admitting: Emergency Medicine

## 2020-11-19 ENCOUNTER — Other Ambulatory Visit: Payer: Self-pay

## 2020-11-19 DIAGNOSIS — K0889 Other specified disorders of teeth and supporting structures: Secondary | ICD-10-CM | POA: Insufficient documentation

## 2020-11-19 DIAGNOSIS — Z5321 Procedure and treatment not carried out due to patient leaving prior to being seen by health care provider: Secondary | ICD-10-CM | POA: Insufficient documentation

## 2020-11-19 NOTE — ED Triage Notes (Signed)
Dental pain to left lower molar.

## 2020-11-24 ENCOUNTER — Encounter: Payer: Self-pay | Admitting: *Deleted

## 2022-09-14 ENCOUNTER — Ambulatory Visit (HOSPITAL_COMMUNITY): Admit: 2022-09-14 | Discharge status: Against Medical Advice | Payer: Self-pay | Source: Home / Self Care
